# Patient Record
Sex: Female | Born: 1975
Health system: Southern US, Community
[De-identification: ages and names within clinical notes are randomized; demographics above are authoritative.]

## PROBLEM LIST (undated history)

## (undated) DIAGNOSIS — E039 Hypothyroidism, unspecified: Secondary | ICD-10-CM

## (undated) DIAGNOSIS — D649 Anemia, unspecified: Secondary | ICD-10-CM

## (undated) DIAGNOSIS — Z90710 Acquired absence of both cervix and uterus: Secondary | ICD-10-CM

## (undated) HISTORY — DX: Acquired absence of both cervix and uterus: Z90.710

## (undated) HISTORY — DX: Anemia, unspecified: D64.9

## (undated) HISTORY — PX: AUGMENTATION MAMMAPLASTY: SUR837

## (undated) HISTORY — PX: TUBAL LIGATION: SHX77

## (undated) HISTORY — DX: Hypothyroidism, unspecified: E03.9

## (undated) HISTORY — PX: DILATION AND CURETTAGE OF UTERUS: SHX78

## (undated) HISTORY — PX: CHOLECYSTECTOMY: SHX55

---

## 2004-11-07 ENCOUNTER — Ambulatory Visit: Payer: Self-pay

## 2004-12-12 ENCOUNTER — Ambulatory Visit: Payer: Self-pay

## 2008-10-13 HISTORY — PX: GASTRIC BYPASS: SHX52

## 2008-10-18 ENCOUNTER — Ambulatory Visit: Payer: Self-pay | Admitting: Internal Medicine

## 2014-03-13 HISTORY — PX: VAGINAL HYSTERECTOMY: SUR661

## 2014-03-22 ENCOUNTER — Ambulatory Visit: Payer: Self-pay | Admitting: Obstetrics and Gynecology

## 2014-03-22 LAB — BASIC METABOLIC PANEL
ANION GAP: 1 — AB (ref 7–16)
BUN: 13 mg/dL (ref 7–18)
CHLORIDE: 105 mmol/L (ref 98–107)
Calcium, Total: 9.2 mg/dL (ref 8.5–10.1)
Co2: 32 mmol/L (ref 21–32)
Creatinine: 0.84 mg/dL (ref 0.60–1.30)
EGFR (Non-African Amer.): >60
Glucose: 69 mg/dL (ref 65–99)
Osmolality: 274 (ref 275–301)
Potassium: 3.9 mmol/L (ref 3.5–5.1)
Sodium: 138 mmol/L (ref 136–145)

## 2014-03-22 LAB — CBC
HCT: 37.3 % (ref 35.0–47.0)
HGB: 12.4 g/dL (ref 12.0–16.0)
MCH: 27.9 pg (ref 26.0–34.0)
MCHC: 33.1 g/dL (ref 32.0–36.0)
MCV: 84 fL (ref 80–100)
Platelet: 226 10*3/uL (ref 150–440)
RBC: 4.44 10*6/uL (ref 3.80–5.20)
RDW: 15.8 % — ABNORMAL HIGH (ref 11.5–14.5)
WBC: 4.3 10*3/uL (ref 3.6–11.0)

## 2014-03-27 ENCOUNTER — Ambulatory Visit: Payer: Self-pay | Admitting: Obstetrics and Gynecology

## 2014-03-28 LAB — PATHOLOGY REPORT

## 2014-03-28 LAB — HEMOGLOBIN: HGB: 10.6 g/dL — ABNORMAL LOW (ref 12.0–16.0)

## 2015-02-03 NOTE — Op Note (Signed)
PATIENT NAME:  Robin Stone, [name] MR#:  [id number] DATE OF BIRTH:  01-Jun-1976  DATE OF PROCEDURE:  03/27/2014  PREOPERATIVE DIAGNOSES: 1.  Leiomyoma uteri.  2.  Abnormal uterine bleeding.   POSTOPERATIVE DIAGNOSES:  1.  Leiomyoma uteri.  2.  Abnormal uterine bleeding.   OPERATIVE PROCEDURE: Transvaginal hysterectomy with bilateral salpingectomy.   SURGEON: Prentice DockerMartin A. Tejon Gracie, M.D.   FIRST ASSISTANT: Dr. Valentino Saxonherry.   SECOND ASSISTANT: Alfredo MartinezJustin Ollis, PA-S  ANESTHESIA: General endotracheal.   INDICATIONS: The patient is a 39 year old multiparous white female with history of symptomatic fibroid uterus, status post Lupron therapy for fibroid size reduction, presents now for definitive surgery.   FINDINGS AT SURGERY: Revealed a fibroid uterus that was top normal size. Fallopian tubes and ovaries appeared normal.   DESCRIPTION OF PROCEDURE: The patient was brought to the operating room where she was placed in the supine position. General endotracheal anesthesia was induced without difficulty. She was placed in dorsal lithotomy position using candy-cane stirrups. A Betadine perineal and intravaginal prep and drape was performed in standard fashion. Foley catheter was placed and was draining clear yellow urine. A weighted speculum was placed into the vagina and double-tooth tenaculum was placed onto the cervix. Posterior colpotomy was made with Mayo scissors. Uterosacral ligaments were clamped, cut and stick tied using 0 Vicryl sutures. The bladder flap was created with the Bovie cautery. The vaginal mucosa and bladder were dissected off the lower uterine segment in standard fashion. The anterior cul-de-sac was eventually entered. The cardinal broad ligament complexes were then sequentially clamped, cut, and stick tied using 0 Vicryl suture. This was carried out to the utero-ovarian ligaments which were then crossclamped, cut and stick tied. The specimen was removed from the operative field.  Moderate oozing was noted from the left infundibulopelvic ligament. Several 2-0 Vicryl sutures were used in a running locking manner in order to optimize hemostasis. This was optimally controlled. The cul-de-sac was reapproximated using a 0 chromic suture in a running baseball stitch manner. The peritoneum was closed with 0 Vicryl suture in a pursestring stitch. This was followed by closure of the vagina with simple interrupted sutures of 2-0 chromic. Upon completion of the procedure, all instrumentation was removed from the vagina. The patient was then awakened, mobilized and taken to the recovery room in satisfactory condition.   ESTIMATED BLOOD LOSS: 20 mL.   URINE OUTPUT: 200 mL.   IV FLUIDS: 1500 mL of crystalloid.   ANTIBIOTICS: Ancef antibiotic prophylaxis.   ____________________________ Prentice DockerMartin A. Debroah Shuttleworth, MD mad:sb D: 03/27/2014 09:39:31 ET T: 03/27/2014 09:53:08 ET JOB#: [id number]  cc: Daphine DeutscherMartin A. Ruweyda Macknight, MD, <Dictator> Prentice DockerMARTIN A Kelsen Celona MD ELECTRONICALLY SIGNED 04/12/2014 17:23

## 2015-04-02 DIAGNOSIS — Z90710 Acquired absence of both cervix and uterus: Secondary | ICD-10-CM

## 2015-04-02 HISTORY — DX: Acquired absence of both cervix and uterus: Z90.710

## 2015-05-03 ENCOUNTER — Encounter: Payer: Self-pay | Admitting: Obstetrics and Gynecology

## 2015-06-19 ENCOUNTER — Encounter: Payer: Self-pay | Admitting: Obstetrics and Gynecology

## 2015-10-02 ENCOUNTER — Ambulatory Visit (INDEPENDENT_AMBULATORY_CARE_PROVIDER_SITE_OTHER): Payer: 59 | Admitting: Obstetrics and Gynecology

## 2015-10-02 ENCOUNTER — Encounter: Payer: Self-pay | Admitting: Obstetrics and Gynecology

## 2015-10-02 VITALS — BP 118/81 | HR 71 | Ht 68.0 in | Wt 179.1 lb

## 2015-10-02 DIAGNOSIS — E039 Hypothyroidism, unspecified: Secondary | ICD-10-CM | POA: Insufficient documentation

## 2015-10-02 DIAGNOSIS — Z01419 Encounter for gynecological examination (general) (routine) without abnormal findings: Secondary | ICD-10-CM | POA: Diagnosis not present

## 2015-10-02 DIAGNOSIS — Z90710 Acquired absence of both cervix and uterus: Secondary | ICD-10-CM

## 2015-10-02 DIAGNOSIS — F325 Major depressive disorder, single episode, in full remission: Secondary | ICD-10-CM | POA: Insufficient documentation

## 2015-10-02 DIAGNOSIS — I10 Essential (primary) hypertension: Secondary | ICD-10-CM | POA: Insufficient documentation

## 2015-10-02 DIAGNOSIS — D649 Anemia, unspecified: Secondary | ICD-10-CM | POA: Insufficient documentation

## 2015-10-02 DIAGNOSIS — F419 Anxiety disorder, unspecified: Secondary | ICD-10-CM | POA: Insufficient documentation

## 2015-10-02 NOTE — Patient Instructions (Signed)
1.  No further Pap smears are needed. 2.  Start mammogram testing next year. 3.  Continue with healthy eating and exercise. 4.  Screening laboratory work is to be done by Dr. Judithann SheenSparks.

## 2015-10-02 NOTE — Progress Notes (Signed)
Patient ID: Robin Stone, female   DOB: 02-05-1976, 39 y.o.   MRN: [id number] Annual visit. TVH with Bilateral salpingectomy and cystoscopy (Leiomyoma; B9 Paratubal cysts)  04/01/2014 Gastric By-Pass Tubal ligation G2 P2002 (C/S) FHX: Diabetes: father BMI: 27  ANNUAL PREVENTATIVE CARE GYN  ENCOUNTER NOTE  Subjective:       Robin Stone is a 39 y.o. [phone] female here for a routine annual gynecologic exam.  Current complaints: 1.  None  Status post TVH, bilateral salpingectomy and cystoscopy in 04/01/2014 for uterine fibroid and benign paratubal cysts.  Patient also has history of gastric bypass, cesarean section 2.  Has lost 8 pounds in the past year with a BMI of 27.  No current active medical problems other than hypothyroidism.    Gynecologic History No LMP recorded. Patient has had a hysterectomy. Contraception: TVH, bilateral salpingectomy Last Pap:no further Paps needed Last mammogram: NA  Obstetric History OB History  Gravida Para Term Preterm AB SAB TAB Ectopic Multiple Living  2 2 2       2     # Outcome Date GA Lbr Len/2nd Weight Sex Delivery Anes PTL Lv  2 Term     M CS-LTranv   Y  1 Term     M CS-LTranv   Y      Past Medical History  Diagnosis Date  . Hypothyroidism   . Anemia     Past Surgical History  Procedure Laterality Date  . Vaginal hysterectomy  03/2014    with bilateal salpingectomy - leiomyoma  . Gastric bypass  2010  . Dilation and curettage of uterus    . Cesarean section    . Cholecystectomy    . Tubal ligation      Current Outpatient Prescriptions on File Prior to Visit  Medication Sig Dispense Refill  . cetirizine (ZYRTEC) 10 MG tablet Take 10 mg by mouth daily.    Marland Kitchen. ibuprofen (ADVIL,MOTRIN) 200 MG tablet Take 200 mg by mouth every 6 (six) hours as needed.    Marland Kitchen. levothyroxine (SYNTHROID, LEVOTHROID) 100 MCG tablet Take 100 mcg by mouth daily before breakfast.    . traZODone (DESYREL) 100 MG tablet Take 100 mg by mouth at  bedtime.    . ferrous sulfate 325 (65 FE) MG tablet Take 325 mg by mouth daily with breakfast. Reported on 10/02/2015     No current facility-administered medications on file prior to visit.    Allergies  Allergen Reactions  . Sulfa Antibiotics     Social History   Social History  . Marital Status: Married    Spouse Name: N/A  . Number of Children: N/A  . Years of Education: N/A   Occupational History  . Not on file.   Social History Main Topics  . Smoking status: Never Smoker   . Smokeless tobacco: Not on file  . Alcohol Use: No  . Drug Use: No  . Sexual Activity: Yes    Birth Control/ Protection: Surgical   Other Topics Concern  . Not on file   Social History Narrative    Family History  Problem Relation Age of Onset  . Diabetes Father   . Heart disease Neg Hx   . Cancer Neg Hx     The following portions of the patient's history were reviewed and updated as appropriate: allergies, current medications, past family history, past medical history, past social history, past surgical history and problem list.  Review of Systems ROS Review of Systems -  General ROS: negative for - chills, fatigue, fever, hot flashes, night sweats, weight gain or weight loss Psychological ROS: negative for - anxiety, decreased libido, depression, mood swings, physical abuse or sexual abuse Ophthalmic ROS: negative for - blurry vision, eye pain or loss of vision ENT ROS: negative for - headaches, hearing change, visual changes or vocal changes Allergy and Immunology ROS: negative for - hives, itchy/watery eyes or seasonal allergies Hematological and Lymphatic ROS: negative for - bleeding problems, bruising, swollen lymph nodes or weight loss Endocrine ROS: negative for - galactorrhea, hair pattern changes, hot flashes, malaise/lethargy, mood swings, palpitations, polydipsia/polyuria, skin changes, temperature intolerance or unexpected weight changes Breast ROS: negative for - new or  changing breast lumps or nipple discharge Respiratory ROS: negative for - cough or shortness of breath Cardiovascular ROS: negative for - chest pain, irregular heartbeat, palpitations or shortness of breath Gastrointestinal ROS: no abdominal pain, change in bowel habits, or black or bloody stools Genito-Urinary ROS: no dysuria, trouble voiding, or hematuria Musculoskeletal ROS: negative for - joint pain or joint stiffness Neurological ROS: negative for - bowel and bladder control changes Dermatological ROS: negative for rash and skin lesion changes   Objective:   BP 118/81 mmHg  Pulse 71  Ht  (1.727 m)  Wt 179 lb 2 oz (81.251 kg)  BMI 27.24 kg/m2 CONSTITUTIONAL: Well-developed, well-nourished female in no acute distress.  PSYCHIATRIC: Normal mood and affect. Normal behavior. Normal judgment and thought content. NEUROLGIC: Alert and oriented to person, place, and time. Normal muscle tone coordination. No cranial nerve deficit noted. HENT:  Normocephalic, atraumatic, External right and left ear normal. Oropharynx is clear and moist EYES: Conjunctivae and EOM are normal. Pupils are equal, round, and reactive to light. No scleral icterus.  NECK: Normal range of motion, supple, no masses.  Normal thyroid.  SKIN: Skin is warm and dry. No rash noted. Not diaphoretic. No erythema. No pallor. CARDIOVASCULAR: Normal heart rate noted, regular rhythm, no murmur. RESPIRATORY: Clear to auscultation bilaterally. Effort and breath sounds normal, no problems with respiration noted. BREASTS: Symmetric in size. No masses, skin changes, nipple drainage, or lymphadenopathy. Mammoplasty scars healed. ABDOMEN: Soft, normal bowel sounds, no distention noted.  No tenderness, rebound or guarding.  BLADDER: Normal PELVIC:  External Genitalia: Normal  BUS: Normal  Vagina: Normal with good vault support  Cervix: surgically absent  Uterus: surgically absent  Adnexa: Normal  RV: External Exam NormaI   MUSCULOSKELETAL: Normal range of motion. No tenderness.  No cyanosis, clubbing, or edema.  2+ distal pulses. LYMPHATIC: No Axillary, Supraclavicular, or Inguinal Adenopathy.    Assessment:   Annual gynecologic examination 40 y.o. Contraception: status post TVH, bilateral salpingectomy  BMI 27   Plan:  Pap: Not needed Mammogram: Not Indicated Stool Guaiac Testing:  Not Indicated Labs: per Dr. Judithann Sheen Routine preventative health maintenance measures emphasized: Exercise/Diet/Weight control, Tobacco Warnings and Alcohol/Substance use risks Start mammograms next year Return to Clinic - 1 Year   Herold Harms, MD  Note: This dictation was prepared with Dragon dictation along with smaller phrase technology. Any transcriptional errors that result from this process are unintentional.

## 2016-02-11 DIAGNOSIS — Z79899 Other long term (current) drug therapy: Secondary | ICD-10-CM | POA: Diagnosis not present

## 2016-02-11 DIAGNOSIS — I10 Essential (primary) hypertension: Secondary | ICD-10-CM | POA: Diagnosis not present

## 2016-02-11 DIAGNOSIS — Z13220 Encounter for screening for lipoid disorders: Secondary | ICD-10-CM | POA: Diagnosis not present

## 2016-02-13 DIAGNOSIS — R61 Generalized hyperhidrosis: Secondary | ICD-10-CM | POA: Diagnosis not present

## 2016-02-13 DIAGNOSIS — F325 Major depressive disorder, single episode, in full remission: Secondary | ICD-10-CM | POA: Diagnosis not present

## 2016-02-13 DIAGNOSIS — E039 Hypothyroidism, unspecified: Secondary | ICD-10-CM | POA: Diagnosis not present

## 2016-02-13 DIAGNOSIS — I10 Essential (primary) hypertension: Secondary | ICD-10-CM | POA: Diagnosis not present

## 2016-02-13 DIAGNOSIS — D649 Anemia, unspecified: Secondary | ICD-10-CM | POA: Diagnosis not present

## 2016-02-21 DIAGNOSIS — R002 Palpitations: Secondary | ICD-10-CM | POA: Diagnosis not present

## 2016-03-11 DIAGNOSIS — J0100 Acute maxillary sinusitis, unspecified: Secondary | ICD-10-CM | POA: Diagnosis not present

## 2016-03-25 ENCOUNTER — Other Ambulatory Visit: Payer: Self-pay | Admitting: Internal Medicine

## 2016-03-25 DIAGNOSIS — Z1231 Encounter for screening mammogram for malignant neoplasm of breast: Secondary | ICD-10-CM

## 2016-04-18 ENCOUNTER — Ambulatory Visit
Admission: RE | Admit: 2016-04-18 | Discharge: 2016-04-18 | Disposition: A | Discharge status: Home / Self Care | Payer: 59 | Source: Ambulatory Visit | Attending: Internal Medicine | Admitting: Internal Medicine

## 2016-04-18 DIAGNOSIS — Z9882 Breast implant status: Secondary | ICD-10-CM | POA: Diagnosis not present

## 2016-04-18 DIAGNOSIS — Z1231 Encounter for screening mammogram for malignant neoplasm of breast: Secondary | ICD-10-CM | POA: Diagnosis not present

## 2016-05-05 ENCOUNTER — Encounter: Payer: Self-pay | Admitting: Registered Nurse

## 2016-05-05 ENCOUNTER — Ambulatory Visit: Payer: Self-pay | Admitting: Registered Nurse

## 2016-05-05 VITALS — BP 120/82 | HR 72 | Temp 98.7°F

## 2016-05-05 DIAGNOSIS — M62838 Other muscle spasm: Secondary | ICD-10-CM

## 2016-05-05 MED ORDER — METAXALONE 800 MG PO TABS: 800.0000 mg | ORAL_TABLET | Freq: Three times a day (TID) | ORAL | 0 refills | Status: AC

## 2016-05-05 NOTE — Progress Notes (Signed)
Subjective:    Patient ID: Robin Stone, female    DOB: 1976/02/24, 40 y.o.   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  Denied loss of bowel/bladder control, saddle paresthesias or arm/leg weakness.  Had pinched nerve 10 years ago  Took muscle relaxers and cleared up in 2 days this is lasting much longer.  Has worked overtime recently.  Usual lifting at work.   Back Pain  Associated symptoms include numbness. Pertinent negatives include no abdominal pain, chest pain, fever, headaches or weakness.      Review of Systems  Constitutional: Negative for activity change, appetite change, chills, diaphoresis, fatigue, fever and unexpected weight change.  HENT: Negative for congestion, dental problem, ear discharge and ear pain.   Eyes: Negative for photophobia, pain, discharge, redness, itching and visual disturbance.  Respiratory: Negative for cough, chest tightness, shortness of breath, wheezing and stridor.   Cardiovascular: Negative for chest pain and leg swelling.  Gastrointestinal: Negative for abdominal pain, diarrhea, nausea and vomiting.  Endocrine: Negative for polydipsia, polyphagia and polyuria.  Musculoskeletal: Positive for arthralgias, back pain, myalgias and neck pain. Negative for gait problem, joint swelling and neck stiffness.  Skin: Negative for color change, pallor, rash and wound.  Allergic/Immunologic: Positive for environmental allergies. Negative for food allergies.  Neurological: Positive for numbness. Negative for dizziness, tremors, seizures, syncope, facial asymmetry, speech difficulty, weakness, light-headedness and headaches.  Hematological: Negative for adenopathy. Does not bruise/bleed easily.  Psychiatric/Behavioral: Positive for sleep disturbance.       Objective:   Physical Exam  Constitutional: She is  oriented to person, place, and time. Vital signs are normal. She appears well-developed and well-nourished.  Non-toxic appearance. She does not have a sickly appearance. She does not appear ill. No distress.  HENT:  Head: Normocephalic and atraumatic.  Right Ear: Hearing and external ear normal.  Left Ear: Hearing and external ear normal.  Nose: Nose normal.  Mouth/Throat: Uvula is midline, oropharynx is clear and moist and mucous membranes are normal. No oropharyngeal exudate.  Eyes: Conjunctivae and EOM are normal. Pupils are equal, round, and reactive to light. Right eye exhibits no discharge. Left eye exhibits no discharge. No scleral icterus.  Neck: Trachea normal and phonation normal. Neck supple. Spinous process tenderness and muscular tenderness present. No tracheal tenderness present. No neck rigidity. Decreased range of motion present. No tracheal deviation, no edema and no erythema present. No thyromegaly present.    Cardiovascular: Normal rate, regular rhythm, normal heart sounds and intact distal pulses.  PMI is not displaced.  Exam reveals no gallop and no friction rub.   No murmur heard. Pulses:      Radial pulses are 2+ on the right side, and 2+ on the left side.  Pulmonary/Chest: Effort normal and breath sounds normal. No stridor. No respiratory distress. She has no decreased breath sounds. She has no wheezes. She has no rhonchi. She has no rales. She exhibits no tenderness.  Abdominal: Soft. She exhibits no distension. There is no guarding.  Musculoskeletal: She exhibits tenderness. She exhibits no edema or deformity.       Right shoulder: Normal.       Left shoulder: Normal.       Right elbow: Normal.      Left elbow: Normal.       Right hip: Normal.  Left hip: Normal.       Right knee: Normal.       Left knee: Normal.       Cervical back: She exhibits decreased range of motion, tenderness, bony tenderness, pain and spasm. She exhibits no swelling, no edema, no  deformity, no laceration and normal pulse.       Thoracic back: She exhibits decreased range of motion, pain and spasm. She exhibits no tenderness, no bony tenderness, no swelling, no edema, no deformity, no laceration and normal pulse.       Lumbar back: Normal.       Right hand: Normal.       Left hand: Normal.  Lymphadenopathy:    She has no cervical adenopathy.  Neurological: She is alert and oriented to person, place, and time. She has normal reflexes. She is not disoriented. She displays no atrophy, no tremor and normal reflexes. No cranial nerve deficit. She exhibits normal muscle tone. She displays no seizure activity. Coordination and gait normal. GCS eye subscore is 4. GCS verbal subscore is 5. GCS motor subscore is 6.  Reflex Scores:      Brachioradialis reflexes are 2+ on the right side and 2+ on the left side.      Patellar reflexes are 2+ on the right side and 2+ on the left side. Bilateral hand grasp equal 5/5 strength and leg strength 5/5 equal bilaterally  Skin: Skin is warm and dry. No rash noted. She is not diaphoretic. No erythema. No pallor.  Psychiatric: She has a normal mood and affect. Her speech is normal and behavior is normal. Judgment and thought content normal.  Nursing note and vitals reviewed.         Assessment & Plan:  A-cervical paraspinal muscle spasm acute  P-Skelaxin  po TID prn muscle spasms.  Home stretches demonstrated to patient-e.g. Arm circles, walking up wall, chest stretches, neck AROM, chin tucks, knee to chest and rock side to side on back.  Consider physical therapy referral if no improvement with prescribed therapy.  Ensure ergonomics correct desk at work avoid repetitive motions if possible/holding phone/laptop in hand use desk/stand.  Patient was instructed to rest, ice, and ROM exercises.  Activity as tolerated.  Avoid alcohol intake while taking skelaxin.  Follow up if symptoms persist or worsen. Discussed slow position changes, no  driving x 8 hours after taking skelaxin probable drowsiness side effect in combination with her chronic medications.  Refused work note. Patient verbalized agreement and understanding of treatment plan.  P2:  [employer].

## 2016-05-12 ENCOUNTER — Encounter: Payer: Self-pay | Admitting: Emergency Medicine

## 2016-05-12 ENCOUNTER — Emergency Department
Admission: EM | Admit: 2016-05-12 | Discharge: 2016-05-12 | Disposition: A | Discharge status: Home / Self Care | Payer: 59 | Attending: Emergency Medicine | Admitting: Emergency Medicine

## 2016-05-12 ENCOUNTER — Emergency Department: Payer: 59

## 2016-05-12 DIAGNOSIS — I10 Essential (primary) hypertension: Secondary | ICD-10-CM | POA: Insufficient documentation

## 2016-05-12 DIAGNOSIS — Y999 Unspecified external cause status: Secondary | ICD-10-CM | POA: Insufficient documentation

## 2016-05-12 DIAGNOSIS — Y929 Unspecified place or not applicable: Secondary | ICD-10-CM | POA: Diagnosis not present

## 2016-05-12 DIAGNOSIS — S99922A Unspecified injury of left foot, initial encounter: Secondary | ICD-10-CM | POA: Diagnosis present

## 2016-05-12 DIAGNOSIS — Y9389 Activity, other specified: Secondary | ICD-10-CM | POA: Insufficient documentation

## 2016-05-12 DIAGNOSIS — M79672 Pain in left foot: Secondary | ICD-10-CM | POA: Diagnosis not present

## 2016-05-12 DIAGNOSIS — S9032XA Contusion of left foot, initial encounter: Secondary | ICD-10-CM | POA: Insufficient documentation

## 2016-05-12 DIAGNOSIS — E039 Hypothyroidism, unspecified: Secondary | ICD-10-CM | POA: Insufficient documentation

## 2016-05-12 DIAGNOSIS — W208XXA Other cause of strike by thrown, projected or falling object, initial encounter: Secondary | ICD-10-CM | POA: Diagnosis not present

## 2016-05-12 MED ORDER — IBUPROFEN 600 MG PO TABS
600.0000 mg | ORAL_TABLET | Freq: Once | ORAL | Status: AC
Start: 2016-05-12 — End: 2016-05-12
  Administered 2016-05-12: 600 mg via ORAL
  Filled 2016-05-12: qty 1

## 2016-05-12 MED ORDER — OXYCODONE-ACETAMINOPHEN 5-325 MG PO TABS: 1.0000 | ORAL_TABLET | Freq: Four times a day (QID) | ORAL | 0 refills | Status: DC | PRN

## 2016-05-12 MED ORDER — IBUPROFEN 600 MG PO TABS
ORAL_TABLET | ORAL | Status: AC
  Filled 2016-05-12: qty 1

## 2016-05-12 MED ORDER — NAPROXEN 500 MG PO TABS: 500.0000 mg | ORAL_TABLET | Freq: Two times a day (BID) | ORAL | 0 refills | Status: AC

## 2016-05-12 MED ORDER — OXYCODONE-ACETAMINOPHEN 5-325 MG PO TABS
1.0000 | ORAL_TABLET | Freq: Once | ORAL | Status: AC
  Administered 2016-05-12: 1 via ORAL
  Filled 2016-05-12: qty 1

## 2016-05-12 NOTE — ED Triage Notes (Signed)
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 Patient with pain and swelling to left foot.

## 2016-05-12 NOTE — ED Provider Notes (Signed)
[facility] Emergency Department Provider Note   ____________________________________________   First MD Initiated Contact with Patient 05/12/16 2021     (approximate)  I have reviewed the triage vital signs and the nursing notes.   HISTORY  Chief Complaint Foot Pain    HPI Robin Stone is a 40 y.o. female patient complain of dorsal left foot pain secondary to blunt trauma. 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 Patient stated there is immediate edema to the dorsal aspect of the foot. Patient state when she flexes her toes tested numbness and tingling sensation. Patient state weightbearing causes increased pain. No palliative measures taken for this complaint. Patient rates the pain as a 6/10. Patient describes the pain as sharp.   Past Medical History:  Diagnosis Date  . Anemia   . History of total vaginal hysterectomy (TVH) 04/02/2015   TVH, bilateral salpingectomy  . Hypothyroidism     Patient Active Problem List   Diagnosis Date Noted  . Absolute anemia 10/02/2015  . Anxiety 10/02/2015  . Benign hypertension 10/02/2015  . Hypothyroidism 10/02/2015  . Major depression in remission (HCC) 10/02/2015  . Morbid obesity (HCC) 10/02/2015  . History of total vaginal hysterectomy (TVH) 04/02/2015    Past Surgical History:  Procedure Laterality Date  . AUGMENTATION MAMMAPLASTY Bilateral    implant and areola reduction  . CESAREAN SECTION    . CHOLECYSTECTOMY    . DILATION AND CURETTAGE OF UTERUS    . GASTRIC BYPASS  2010  . TUBAL LIGATION    . VAGINAL HYSTERECTOMY  03/2014   with bilateal salpingectomy - leiomyoma    Prior to Admission medications   Medication Sig Start Date End Date Taking? Authorizing Provider  cetirizine (ZYRTEC) 10 MG tablet Take 10 mg by mouth daily.    Historical Provider, MD  ferrous sulfate 325 (65 FE) MG tablet Take 325 mg by mouth daily with breakfast. Reported on  10/02/2015    Historical Provider, MD  hydrochlorothiazide (HYDRODIURIL) 25 MG tablet Take by mouth. 08/16/15   Historical Provider, MD  ibuprofen (ADVIL,MOTRIN) 200 MG tablet Take 200 mg by mouth every 6 (six) hours as needed.    Historical Provider, MD  levothyroxine (SYNTHROID, LEVOTHROID) 100 MCG tablet Take 100 mcg by mouth daily before breakfast.    Historical Provider, MD  naproxen (NAPROSYN) 500 MG tablet Take 1 tablet (500 mg total) by mouth 2 (two) times daily with a meal. 05/12/16   Joni Reining, PA-C  oxyCODONE-acetaminophen (ROXICET) 5-325 MG tablet Take 1 tablet by mouth every 6 (six) hours as needed for moderate pain. 05/12/16   Joni Reining, PA-C  sertraline (ZOLOFT) 50 MG tablet Take by mouth. 12/04/15   Historical Provider, MD  traZODone (DESYREL) 100 MG tablet Take 100 mg by mouth at bedtime.    Historical Provider, MD    Allergies Sulfa antibiotics  Family History  Problem Relation Age of Onset  . Diabetes Father   . Heart disease Neg Hx   . Cancer Neg Hx     Social History Social History  Substance Use Topics  . Smoking status: Never Smoker  . Smokeless tobacco: Never Used  . Alcohol use No    Review of Systems Constitutional: No fever/chills Eyes: No visual changes. ENT: No sore throat. Cardiovascular: Denies chest pain. Respiratory: Denies shortness of breath. Gastrointestinal: No abdominal pain.  No nausea, no vomiting.  No diarrhea.  No constipation. Genitourinary: Negative for dysuria.  Musculoskeletal: Left foot pain Skin: Negative for rash. Neurological: Negative for headaches, focal weakness or numbness. Psychiatric: Anxiety Endocrine:Hypothyroidism Hematological/Lymphatic: Allergic/Immunilogical: Sulfa ____________________________________________   PHYSICAL EXAM:  VITAL SIGNS: ED Triage Vitals  Enc Vitals Group     BP 05/12/16 1909 126/88     Pulse Rate 05/12/16 1909 64     Resp 05/12/16 1909 18     Temp 05/12/16 1909 97.9 F (36.6  C)     Temp Source 05/12/16 1909 Oral     SpO2 05/12/16 1909 99 %     Weight 05/12/16 1909 170 lb (77.1 kg)     Height 05/12/16 1909  (1.727 m)     Head Circumference --      Peak Flow --      Pain Score 05/12/16 1920 6     Pain Loc --      Pain Edu? --      Excl. in GC? --     Constitutional: Alert and oriented. Well appearing and in no acute distress. Eyes: Conjunctivae are normal. PERRL. EOMI. Head: Atraumatic. Nose: No congestion/rhinnorhea. Mouth/Throat: Mucous membranes are moist.  Oropharynx non-erythematous. Neck: No stridor.  No cervical spine tenderness to palpation. Hematological/Lymphatic/Immunilogical: No cervical lymphadenopathy. Cardiovascular: Normal rate, regular rhythm. Grossly normal heart sounds.  Good peripheral circulation. Respiratory: Normal respiratory effort.  No retractions. Lungs CTAB. Gastrointestinal: Soft and nontender. No distention. No abdominal bruits. No CVA tenderness. Genitourinary:  Musculoskeletal: No obvious deformity of the left foot. Ecchymosis dorsal aspect of the left foot. Moderate guarding palpation dose aspect of the left foot. Neurologic:  Normal speech and language. No gross focal neurologic deficits are appreciated. No gait instability. Skin:  Skin is warm, dry and intact. No rash noted. Abrasion and ecchymosis Psychiatric: Mood and affect are normal. Speech and behavior are normal.  ____________________________________________   LABS (all labs ordered are listed, but only abnormal results are displayed)  Labs Reviewed - No data to display ____________________________________________  EKG   ____________________________________________  RADIOLOGY  No acute findings on x-ray of the left foot. ____________________________________________   PROCEDURES  Procedure(s) performed: None  Procedures  Critical Care performed: No  ____________________________________________   INITIAL IMPRESSION / ASSESSMENT AND PLAN  / ED COURSE  Pertinent labs & imaging results that were available during my care of the patient were reviewed by me and considered in my medical decision making (see chart for details).  Left foot contusion. Discussed x-ray finding with patient. Patient placed an open shoe. Patient given discharge care instructions. Patient advised no prolonged standing for 3 days.  Clinical Course     ____________________________________________   FINAL CLINICAL IMPRESSION(S) / ED DIAGNOSES  Final diagnoses:  Foot contusion, left, initial encounter      NEW MEDICATIONS STARTED DURING THIS VISIT:  New Prescriptions   NAPROXEN (NAPROSYN) 500 MG TABLET    Take 1 tablet (500 mg total) by mouth 2 (two) times daily with a meal.   OXYCODONE-ACETAMINOPHEN (ROXICET) 5-325 MG TABLET    Take 1 tablet by mouth every 6 (six) hours as needed for moderate pain.     Note:  This document was prepared using Dragon voice recognition software and may include unintentional dictation errors.    Joni Reining, PA-C 05/12/16 2117    Loleta Rose, MD 05/13/16 [phone]

## 2016-05-12 NOTE — ED Notes (Signed)
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 States she propped it up for rest of practice. Bruising noticed L top/side of foot. Pt states when she bends her toes she feels a tingling/numb sensation in foot.

## 2016-05-12 NOTE — Discharge Instructions (Signed)
Were open shoe for 3-5 days as needed.

## 2016-05-12 NOTE — ED Notes (Signed)
  Reviewed d/c instructions, follow-up care, and prescriptions with pt. Pt verbalized understanding 

## 2016-07-19 DIAGNOSIS — J029 Acute pharyngitis, unspecified: Secondary | ICD-10-CM | POA: Diagnosis not present

## 2016-07-19 DIAGNOSIS — J069 Acute upper respiratory infection, unspecified: Secondary | ICD-10-CM | POA: Diagnosis not present

## 2016-08-08 DIAGNOSIS — E039 Hypothyroidism, unspecified: Secondary | ICD-10-CM | POA: Diagnosis not present

## 2016-08-08 DIAGNOSIS — I10 Essential (primary) hypertension: Secondary | ICD-10-CM | POA: Diagnosis not present

## 2016-08-08 DIAGNOSIS — Z79899 Other long term (current) drug therapy: Secondary | ICD-10-CM | POA: Diagnosis not present

## 2016-08-08 DIAGNOSIS — R002 Palpitations: Secondary | ICD-10-CM | POA: Diagnosis not present

## 2016-08-15 DIAGNOSIS — F325 Major depressive disorder, single episode, in full remission: Secondary | ICD-10-CM | POA: Diagnosis not present

## 2016-08-15 DIAGNOSIS — E039 Hypothyroidism, unspecified: Secondary | ICD-10-CM | POA: Diagnosis not present

## 2016-08-15 DIAGNOSIS — I10 Essential (primary) hypertension: Secondary | ICD-10-CM | POA: Diagnosis not present

## 2016-08-15 DIAGNOSIS — F3341 Major depressive disorder, recurrent, in partial remission: Secondary | ICD-10-CM | POA: Diagnosis not present

## 2016-09-26 NOTE — Progress Notes (Signed)
[facility] Patient ID: Robin Stone, female   DOB: [birth year], 40 y.o.   MRN: [id number] Annual visit. TVH with Bilateral salpingectomy and cystoscopy (Leiomyoma; B9 Paratubal cysts)  04/01/2014 Gastric By-Pass Tubal ligation G2 P2002 (C/S) FHX: Diabetes: father BMI: 27  ANNUAL PREVENTATIVE CARE GYN  ENCOUNTER NOTE  Subjective:       Robin Stone is a 40 y.o. 172P2002 female here for a routine annual gynecologic exam.  Current complaints: 1.  None  Status post TVH, bilateral salpingectomy and cystoscopy in 04/01/2014 for uterine fibroid and benign paratubal cysts.  Patient also has history of gastric bypass, cesarean section 2.  Has Gained 13 pounds in the past year. No current active medical problems other than hypothyroidism which is monitored by Dr. Judithann SheenSparks   Gynecologic History No LMP recorded. Patient has had a hysterectomy. Contraception: TVH, bilateral salpingectomy Last Pap:no further Paps needed Last mammogram: 04/2016 birad 1  Obstetric History OB History  Gravida Para Term Preterm AB Living  2 2 2     2   SAB TAB Ectopic Multiple Live Births          2    # Outcome Date GA Lbr Len/2nd Weight Sex Delivery Anes PTL Lv  2 Term     M CS-LTranv   LIV  1 Term     M CS-LTranv   LIV      Past Medical History:  Diagnosis Date  . Anemia   . History of total vaginal hysterectomy (TVH) 04/02/2015   TVH, bilateral salpingectomy  . Hypothyroidism     Past Surgical History:  Procedure Laterality Date  . AUGMENTATION MAMMAPLASTY Bilateral    implant and areola reduction  . CESAREAN SECTION    . CHOLECYSTECTOMY    . DILATION AND CURETTAGE OF UTERUS    . GASTRIC BYPASS  2010  . TUBAL LIGATION    . VAGINAL HYSTERECTOMY  03/2014   with bilateal salpingectomy - leiomyoma    Current Outpatient Prescriptions on File Prior to Visit  Medication Sig Dispense Refill  . cetirizine (ZYRTEC) 10 MG tablet Take 10 mg by mouth daily.    . ferrous sulfate 325 (65 FE) MG  tablet Take 325 mg by mouth daily with breakfast. Reported on 10/02/2015    . hydrochlorothiazide (HYDRODIURIL) 25 MG tablet Take by mouth.    Marland Kitchen. ibuprofen (ADVIL,MOTRIN) 200 MG tablet Take 200 mg by mouth every 6 (six) hours as needed.    Marland Kitchen. levothyroxine (SYNTHROID, LEVOTHROID) 100 MCG tablet Take 100 mcg by mouth daily before breakfast.    . naproxen (NAPROSYN) 500 MG tablet Take 1 tablet (500 mg total) by mouth 2 (two) times daily with a meal. 14 tablet 0  . oxyCODONE-acetaminophen (ROXICET) 5-325 MG tablet Take 1 tablet by mouth every 6 (six) hours as needed for moderate pain. 12 tablet 0  . sertraline (ZOLOFT) 50 MG tablet Take by mouth.    . traZODone (DESYREL) 100 MG tablet Take 100 mg by mouth at bedtime.     No current facility-administered medications on file prior to visit.     Allergies  Allergen Reactions  . Sulfa Antibiotics     Social History   Social History  . Marital status: Married    Spouse name: N/A  . Number of children: N/A  . Years of education: N/A   Occupational History  . Not on file.   Social History Main Topics  . Smoking status: Never Smoker  . Smokeless tobacco: Never  Used  . Alcohol use No  . Drug use: No  . Sexual activity: Not on file   Other Topics Concern  . Not on file   Social History Narrative  . No narrative on file    Family History  Problem Relation Age of Onset  . Diabetes Father   . Heart disease Neg Hx   . Cancer Neg Hx     The following portions of the patient's history were reviewed and updated as appropriate: allergies, current medications, past family history, past medical history, past social history, past surgical history and problem list.  Review of Systems ROS Review of Systems - General ROS: negative for - chills, fatigue, fever, hot flashes, night sweats, weight gain or weight loss Psychological ROS: negative for - anxiety, decreased libido, depression, mood swings, physical abuse or sexual abuse Ophthalmic  ROS: negative for - blurry vision, eye pain or loss of vision ENT ROS: negative for - headaches, hearing change, visual changes or vocal changes Allergy and Immunology ROS: negative for - hives, itchy/watery eyes or seasonal allergies Hematological and Lymphatic ROS: negative for - bleeding problems, bruising, swollen lymph nodes or weight loss Endocrine ROS: negative for - galactorrhea, hair pattern changes, hot flashes, malaise/lethargy, mood swings, palpitations, polydipsia/polyuria, skin changes, temperature intolerance or unexpected weight changes Breast ROS: negative for - new or changing breast lumps or nipple discharge Respiratory ROS: negative for - cough or shortness of breath Cardiovascular ROS: negative for - chest pain, irregular heartbeat, palpitations or shortness of breath Gastrointestinal ROS: no abdominal pain, change in bowel habits, or black or bloody stools Genito-Urinary ROS: no dysuria, trouble voiding, or hematuria Musculoskeletal ROS: negative for - joint pain or joint stiffness Neurological ROS: negative for - bowel and bladder control changes Dermatological ROS: negative for rash and skin lesion changes   Objective:   BP 103/68   Pulse 92   Ht 5\' 8"  (1.727 m)   Wt 185 lb 7 oz (84.1 kg)   BMI 28.20 kg/m  CONSTITUTIONAL: Well-developed, well-nourished female in no acute distress.  PSYCHIATRIC: Normal mood and affect. Normal behavior. Normal judgment and thought content. NEUROLGIC: Alert and oriented to person, place, and time. Normal muscle tone coordination. No cranial nerve deficit noted. HENT:  Normocephalic, atraumatic, External right and left ear normal. Oropharynx is clear and moist EYES: Conjunctivae and EOM are normal. Pupils are equal, round, and reactive to light. No scleral icterus.  NECK: Normal range of motion, supple, no masses.  Normal thyroid.  SKIN: Skin is warm and dry. No rash noted. Not diaphoretic. No erythema. No pallor. CARDIOVASCULAR:  Normal heart rate noted, regular rhythm, no murmur. RESPIRATORY: Clear to auscultation bilaterally. Effort and breath sounds normal, no problems with respiration noted. BREASTS: Symmetric in size. No masses, skin changes, nipple drainage, or lymphadenopathy. Mammoplasty scars healed. ABDOMEN: Soft, normal bowel sounds, no distention noted.  No tenderness, rebound or guarding.  BLADDER: Normal PELVIC:  External Genitalia: Normal  BUS: Normal  Vagina: Normal with good vault support  Cervix: surgically absent  Uterus: surgically absent  Adnexa: Normal  RV: External Exam NormaI  MUSCULOSKELETAL: Normal range of motion. No tenderness.  No cyanosis, clubbing, or edema.  2+ distal pulses. LYMPHATIC: No Axillary, Supraclavicular, or Inguinal Adenopathy.    Assessment:   Annual gynecologic examination 40 y.o. Contraception: status post TVH, bilateral salpingectomy  BMI 27  Weight gain   Plan:  Pap: Not needed Mammogram:utd Stool Guaiac Testing:  Not Indicated Labs: per Dr. Judithann Sheen Routine preventative  health maintenance measures emphasized: Exercise/Diet/Weight control, Tobacco Warnings and Alcohol/Substance use risks Start mammograms next year Return to Clinic - 1 Year   Darol Destinerystal Miller, [place]  Note: This dictation was prepared with Dragon dictation along with smaller phrase technology. Any transcriptional errors that result from this process are unintentional.

## 2016-10-02 ENCOUNTER — Encounter: Payer: 59 | Admitting: Obstetrics and Gynecology

## 2016-10-02 ENCOUNTER — Encounter: Payer: Self-pay | Admitting: Obstetrics and Gynecology

## 2016-10-02 ENCOUNTER — Ambulatory Visit: Payer: 59 | Admitting: Obstetrics and Gynecology

## 2016-10-02 VITALS — BP 103/68 | HR 92 | Ht 68.0 in | Wt 185.4 lb

## 2016-10-02 DIAGNOSIS — Z90710 Acquired absence of both cervix and uterus: Secondary | ICD-10-CM | POA: Diagnosis not present

## 2016-10-02 DIAGNOSIS — Z9884 Bariatric surgery status: Secondary | ICD-10-CM | POA: Insufficient documentation

## 2016-10-02 DIAGNOSIS — Z01419 Encounter for gynecological examination (general) (routine) without abnormal findings: Secondary | ICD-10-CM

## 2016-10-02 DIAGNOSIS — R635 Abnormal weight gain: Secondary | ICD-10-CM | POA: Diagnosis not present

## 2016-10-02 NOTE — Patient Instructions (Signed)
1. No further Pap smears needed 2. Mammogram already obtained this year 3. Continue with healthy eating and exercise with controlled weight loss 4. Screening labs through primary care-Dr. Doy Hutching 5. Return in 1 year for annual exam  Health Maintenance, Female Introduction Adopting a healthy lifestyle and getting preventive care can go a long way to promote health and wellness. Talk with your health care provider about what schedule of regular examinations is right for you. This is a good chance for you to check in with your provider about disease prevention and staying healthy. In between checkups, there are plenty of things you can do on your own. Experts have done a lot of research about which lifestyle changes and preventive measures are most likely to keep you healthy. Ask your health care provider for more information. Weight and diet Eat a healthy diet  Be sure to include plenty of vegetables, fruits, low-fat dairy products, and lean protein.  Do not eat a lot of foods high in solid fats, added sugars, or salt.  Get regular exercise. This is one of the most important things you can do for your health.  Most adults should exercise for at least 150 minutes each week. The exercise should increase your heart rate and make you sweat (moderate-intensity exercise).  Most adults should also do strengthening exercises at least twice a week. This is in addition to the moderate-intensity exercise. Maintain a healthy weight  Body mass index (BMI) is a measurement that can be used to identify possible weight problems. It estimates body fat based on height and weight. Your health care provider can help determine your BMI and help you achieve or maintain a healthy weight.  For females 36 years of age and older:  A BMI below 18.5 is considered underweight.  A BMI of 18.5 to 24.9 is normal.  A BMI of 25 to 29.9 is considered overweight.  A BMI of 30 and above is considered obese. Watch levels  of cholesterol and blood lipids  You should start having your blood tested for lipids and cholesterol at 40 years of age, then have this test every 5 years.  You may need to have your cholesterol levels checked more often if:  Your lipid or cholesterol levels are high.  You are older than 40 years of age.  You are at high risk for heart disease. Cancer screening Lung Cancer  Lung cancer screening is recommended for adults 52-98 years old who are at high risk for lung cancer because of a history of smoking.  A yearly low-dose CT scan of the lungs is recommended for people who:  Currently smoke.  Have quit within the past 15 years.  Have at least a 30-pack-year history of smoking. A pack year is smoking an average of one pack of cigarettes a day for 1 year.  Yearly screening should continue until it has been 15 years since you quit.  Yearly screening should stop if you develop a health problem that would prevent you from having lung cancer treatment. Breast Cancer  Practice breast self-awareness. This means understanding how your breasts normally appear and feel.  It also means doing regular breast self-exams. Let your health care provider know about any changes, no matter how small.  If you are in your 20s or 30s, you should have a clinical breast exam (CBE) by a health care provider every 1-3 years as part of a regular health exam.  If you are 4 or older, have a CBE every  year. Also consider having a breast X-ray (mammogram) every year.  If you have a family history of breast cancer, talk to your health care provider about genetic screening.  If you are at high risk for breast cancer, talk to your health care provider about having an MRI and a mammogram every year.  Breast cancer gene (BRCA) assessment is recommended for women who have family members with BRCA-related cancers. BRCA-related cancers include:  Breast.  Ovarian.  Tubal.  Peritoneal cancers.  Results of  the assessment will determine the need for genetic counseling and BRCA1 and BRCA2 testing. Cervical Cancer  Your health care provider may recommend that you be screened regularly for cancer of the pelvic organs (ovaries, uterus, and vagina). This screening involves a pelvic examination, including checking for microscopic changes to the surface of your cervix (Pap test). You may be encouraged to have this screening done every 3 years, beginning at age 88.  For women ages 49-65, health care providers may recommend pelvic exams and Pap testing every 3 years, or they may recommend the Pap and pelvic exam, combined with testing for human papilloma virus (HPV), every 5 years. Some types of HPV increase your risk of cervical cancer. Testing for HPV may also be done on women of any age with unclear Pap test results.  Other health care providers may not recommend any screening for nonpregnant women who are considered low risk for pelvic cancer and who do not have symptoms. Ask your health care provider if a screening pelvic exam is right for you.  If you have had past treatment for cervical cancer or a condition that could lead to cancer, you need Pap tests and screening for cancer for at least 20 years after your treatment. If Pap tests have been discontinued, your risk factors (such as having a new sexual partner) need to be reassessed to determine if screening should resume. Some women have medical problems that increase the chance of getting cervical cancer. In these cases, your health care provider may recommend more frequent screening and Pap tests. Colorectal Cancer  This type of cancer can be detected and often prevented.  Routine colorectal cancer screening usually begins at 40 years of age and continues through 40 years of age.  Your health care provider may recommend screening at an earlier age if you have risk factors for colon cancer.  Your health care provider may also recommend using home test  kits to check for hidden blood in the stool.  A small camera at the end of a tube can be used to examine your colon directly (sigmoidoscopy or colonoscopy). This is done to check for the earliest forms of colorectal cancer.  Routine screening usually begins at age 33.  Direct examination of the colon should be repeated every 5-10 years through 40 years of age. However, you may need to be screened more often if early forms of precancerous polyps or small growths are found. Skin Cancer  Check your skin from head to toe regularly.  Tell your health care provider about any new moles or changes in moles, especially if there is a change in a mole's shape or color.  Also tell your health care provider if you have a mole that is larger than the size of a pencil eraser.  Always use sunscreen. Apply sunscreen liberally and repeatedly throughout the day.  Protect yourself by wearing long sleeves, pants, a wide-brimmed hat, and sunglasses whenever you are outside. Heart disease, diabetes, and high blood pressure  High blood pressure causes heart disease and increases the risk of stroke. High blood pressure is more likely to develop in:  People who have blood pressure in the high end of the normal range (130-139/85-89 mm Hg).  People who are overweight or obese.  People who are African American.  If you are 36-29 years of age, have your blood pressure checked every 3-5 years. If you are 71 years of age or older, have your blood pressure checked every year. You should have your blood pressure measured twice-once when you are at a hospital or clinic, and once when you are not at a hospital or clinic. Record the average of the two measurements. To check your blood pressure when you are not at a hospital or clinic, you can use:  An automated blood pressure machine at a pharmacy.  A home blood pressure monitor.  If you are between 23 years and 75 years old, ask your health care provider if you should  take aspirin to prevent strokes.  Have regular diabetes screenings. This involves taking a blood sample to check your fasting blood sugar level.  If you are at a normal weight and have a low risk for diabetes, have this test once every three years after 40 years of age.  If you are overweight and have a high risk for diabetes, consider being tested at a younger age or more often. Preventing infection Hepatitis B  If you have a higher risk for hepatitis B, you should be screened for this virus. You are considered at high risk for hepatitis B if:  You were born in a country where hepatitis B is common. Ask your health care provider which countries are considered high risk.  Your parents were born in a high-risk country, and you have not been immunized against hepatitis B (hepatitis B vaccine).  You have HIV or AIDS.  You use needles to inject street drugs.  You live with someone who has hepatitis B.  You have had sex with someone who has hepatitis B.  You get hemodialysis treatment.  You take certain medicines for conditions, including cancer, organ transplantation, and autoimmune conditions. Hepatitis C  Blood testing is recommended for:  Everyone born from [birth year] through 1965.  Anyone with known risk factors for hepatitis C. Sexually transmitted infections (STIs)  You should be screened for sexually transmitted infections (STIs) including gonorrhea and chlamydia if:  You are sexually active and are younger than 40 years of age.  You are older than 40 years of age and your health care provider tells you that you are at risk for this type of infection.  Your sexual activity has changed since you were last screened and you are at an increased risk for chlamydia or gonorrhea. Ask your health care provider if you are at risk.  If you do not have HIV, but are at risk, it may be recommended that you take a prescription medicine daily to prevent HIV infection. This is called  pre-exposure prophylaxis (PrEP). You are considered at risk if:  You are sexually active and do not regularly use condoms or know the HIV status of your partner(s).  You take drugs by injection.  You are sexually active with a partner who has HIV. Talk with your health care provider about whether you are at high risk of being infected with HIV. If you choose to begin PrEP, you should first be tested for HIV. You should then be tested every 3 months for as long  as you are taking PrEP. Pregnancy  If you are premenopausal and you may become pregnant, ask your health care provider about preconception counseling.  If you may become pregnant, take 400 to 800 micrograms (mcg) of folic acid every day.  If you want to prevent pregnancy, talk to your health care provider about birth control (contraception). Osteoporosis and menopause  Osteoporosis is a disease in which the bones lose minerals and strength with aging. This can result in serious bone fractures. Your risk for osteoporosis can be identified using a bone density scan.  If you are 86 years of age or older, or if you are at risk for osteoporosis and fractures, ask your health care provider if you should be screened.  Ask your health care provider whether you should take a calcium or vitamin D supplement to lower your risk for osteoporosis.  Menopause may have certain physical symptoms and risks.  Hormone replacement therapy may reduce some of these symptoms and risks. Talk to your health care provider about whether hormone replacement therapy is right for you. Follow these instructions at home:  Schedule regular health, dental, and eye exams.  Stay current with your immunizations.  Do not use any tobacco products including cigarettes, chewing tobacco, or electronic cigarettes.  If you are pregnant, do not drink alcohol.  If you are breastfeeding, limit how much and how often you drink alcohol.  Limit alcohol intake to no more  than 1 drink per day for nonpregnant women. One drink equals 12 ounces of beer, 5 ounces of wine, or 1 ounces of hard liquor.  Do not use street drugs.  Do not share needles.  Ask your health care provider for help if you need support or information about quitting drugs.  Tell your health care provider if you often feel depressed.  Tell your health care provider if you have ever been abused or do not feel safe at home. This information is not intended to replace advice given to you by your health care provider. Make sure you discuss any questions you have with your health care provider. Document Released: 04/14/2011 Document Revised: 03/06/2016 Document Reviewed: 07/03/2015  2017 [employer]

## 2017-03-25 ENCOUNTER — Other Ambulatory Visit: Payer: Self-pay | Admitting: Internal Medicine

## 2017-03-25 DIAGNOSIS — Z1231 Encounter for screening mammogram for malignant neoplasm of breast: Secondary | ICD-10-CM

## 2017-04-20 ENCOUNTER — Ambulatory Visit
Admission: RE | Admit: 2017-04-20 | Discharge: 2017-04-20 | Disposition: A | Discharge status: Home / Self Care | Payer: 59 | Source: Ambulatory Visit | Attending: Internal Medicine | Admitting: Internal Medicine

## 2017-04-20 DIAGNOSIS — Z1231 Encounter for screening mammogram for malignant neoplasm of breast: Secondary | ICD-10-CM | POA: Diagnosis not present

## 2017-04-20 DIAGNOSIS — I10 Essential (primary) hypertension: Secondary | ICD-10-CM | POA: Diagnosis not present

## 2017-04-20 DIAGNOSIS — D649 Anemia, unspecified: Secondary | ICD-10-CM | POA: Diagnosis not present

## 2017-04-20 DIAGNOSIS — E039 Hypothyroidism, unspecified: Secondary | ICD-10-CM | POA: Diagnosis not present

## 2017-04-20 DIAGNOSIS — Z79899 Other long term (current) drug therapy: Secondary | ICD-10-CM | POA: Diagnosis not present

## 2017-04-20 DIAGNOSIS — F325 Major depressive disorder, single episode, in full remission: Secondary | ICD-10-CM | POA: Diagnosis not present

## 2017-06-19 DIAGNOSIS — F900 Attention-deficit hyperactivity disorder, predominantly inattentive type: Secondary | ICD-10-CM | POA: Diagnosis not present

## 2017-06-19 DIAGNOSIS — E039 Hypothyroidism, unspecified: Secondary | ICD-10-CM | POA: Diagnosis not present

## 2017-06-19 DIAGNOSIS — Z79899 Other long term (current) drug therapy: Secondary | ICD-10-CM | POA: Diagnosis not present

## 2017-06-19 DIAGNOSIS — I10 Essential (primary) hypertension: Secondary | ICD-10-CM | POA: Diagnosis not present

## 2017-06-24 ENCOUNTER — Ambulatory Visit: Payer: Self-pay | Admitting: Physician Assistant

## 2017-06-24 ENCOUNTER — Encounter: Payer: Self-pay | Admitting: Physician Assistant

## 2017-06-24 VITALS — BP 132/84 | HR 69 | Temp 98.4°F

## 2017-06-24 DIAGNOSIS — J069 Acute upper respiratory infection, unspecified: Secondary | ICD-10-CM

## 2017-06-24 MED ORDER — AZITHROMYCIN 250 MG PO TABS: ORAL_TABLET | ORAL | 0 refills | Status: AC

## 2017-06-24 MED ORDER — METHYLPREDNISOLONE 4 MG PO TBPK: ORAL_TABLET | ORAL | 0 refills | Status: AC

## 2017-06-24 NOTE — Progress Notes (Signed)
S: C/o cough, sore throat, runny nose and congestion for 3 days, ? fever, chills, denies cp/sob, v/d; mucus is cloudy, feels like its moving into her chest    Using otc meds:   O: PE: vitals wnl, na,d perrl eomi, normocephalic, tms dull, nasal mucosa red and swollen, throat injected, neck supple no lymph, lungs c t a, cv rrr, neuro intact  A:  Acute  uri   P: drink fluids, continue regular meds , use otc meds of choice, return if not improving in 5 days, return earlier if worsening , medrol dose, zpack

## 2017-07-08 DIAGNOSIS — Z9884 Bariatric surgery status: Secondary | ICD-10-CM | POA: Diagnosis not present

## 2017-07-08 DIAGNOSIS — E162 Hypoglycemia, unspecified: Secondary | ICD-10-CM | POA: Diagnosis not present

## 2017-07-24 DIAGNOSIS — K912 Postsurgical malabsorption, not elsewhere classified: Secondary | ICD-10-CM | POA: Diagnosis not present

## 2017-07-24 DIAGNOSIS — E162 Hypoglycemia, unspecified: Secondary | ICD-10-CM | POA: Diagnosis not present

## 2017-07-24 DIAGNOSIS — Z23 Encounter for immunization: Secondary | ICD-10-CM | POA: Diagnosis not present

## 2017-07-24 DIAGNOSIS — Z9884 Bariatric surgery status: Secondary | ICD-10-CM | POA: Diagnosis not present

## 2017-08-05 DIAGNOSIS — Z23 Encounter for immunization: Secondary | ICD-10-CM | POA: Diagnosis not present

## 2017-08-10 DIAGNOSIS — Z23 Encounter for immunization: Secondary | ICD-10-CM | POA: Diagnosis not present

## 2017-08-21 DIAGNOSIS — E162 Hypoglycemia, unspecified: Secondary | ICD-10-CM | POA: Diagnosis not present

## 2017-08-21 DIAGNOSIS — Z23 Encounter for immunization: Secondary | ICD-10-CM | POA: Diagnosis not present

## 2017-09-18 DIAGNOSIS — Z0189 Encounter for other specified special examinations: Secondary | ICD-10-CM | POA: Diagnosis not present

## 2017-10-09 NOTE — Progress Notes (Deleted)
[facility] Patient ID: Robin Stone, female   DOB: [birth year], 41 y.o.   MRN: [id number] Annual visit. TVH with Bilateral salpingectomy and cystoscopy (Leiomyoma; B9 Paratubal cysts)  04/01/2014 Gastric By-Pass Tubal ligation G2 P2002 (C/S) FHX: Diabetes: father BMI: 27  ANNUAL PREVENTATIVE CARE GYN  ENCOUNTER NOTE  Subjective:       Robin Stone is a 41 y.o. 232P2002 female here for a routine annual gynecologic exam.  Current complaints: 1.  None  Status post TVH, bilateral salpingectomy and cystoscopy in 04/01/2014 for uterine fibroid and benign paratubal cysts.  Patient also has history of gastric bypass, cesarean section 2.  Has Gained 13 pounds in the past year. No current active medical problems other than hypothyroidism which is monitored by Dr. Judithann SheenSparks   Gynecologic History No LMP recorded. Patient has had a hysterectomy. Contraception: TVH, bilateral salpingectomy Last Pap:no further Paps needed Last mammogram: 04/2017 birad 1  Obstetric History OB History  Gravida Para Term Preterm AB Living  2 2 2     2   SAB TAB Ectopic Multiple Live Births          2    # Outcome Date GA Lbr Len/2nd Weight Sex Delivery Anes PTL Lv  2 Term     M CS-LTranv   LIV  1 Term     M CS-LTranv   LIV      Past Medical History:  Diagnosis Date  . Anemia   . History of total vaginal hysterectomy (TVH) 04/02/2015   TVH, bilateral salpingectomy  . Hypothyroidism     Past Surgical History:  Procedure Laterality Date  . AUGMENTATION MAMMAPLASTY Bilateral    implant and areola reduction  . CESAREAN SECTION    . CHOLECYSTECTOMY    . DILATION AND CURETTAGE OF UTERUS    . GASTRIC BYPASS  2010  . TUBAL LIGATION    . VAGINAL HYSTERECTOMY  03/2014   with bilateal salpingectomy - leiomyoma    Current Outpatient Medications on File Prior to Visit  Medication Sig Dispense Refill  . amitriptyline (ELAVIL) 25 MG tablet Take 25 mg by mouth at bedtime.    Marland Kitchen. azithromycin (ZITHROMAX Z-PAK) 250 MG  tablet 2 pills today then 1 pill a day for 4 days 6 each 0  . cetirizine (ZYRTEC) 10 MG tablet Take 10 mg by mouth daily.    . ferrous sulfate 325 (65 FE) MG tablet Take 325 mg by mouth daily with breakfast. Reported on 10/02/2015    . hydrochlorothiazide (HYDRODIURIL) 25 MG tablet Take by mouth.    Marland Kitchen. ibuprofen (ADVIL,MOTRIN) 200 MG tablet Take 200 mg by mouth every 6 (six) hours as needed.    Marland Kitchen. levothyroxine (SYNTHROID, LEVOTHROID) 125 MCG tablet Take by mouth.    . methylPREDNISolone (MEDROL DOSEPAK) 4 MG TBPK tablet Take 6 pills on day one then decrease by 1 pill each day 21 tablet 0  . naproxen (NAPROSYN) 500 MG tablet Take 1 tablet (500 mg total) by mouth 2 (two) times daily with a meal. (Patient not taking: Reported on 06/24/2017) 14 tablet 0  . traZODone (DESYREL) 100 MG tablet Take 100 mg by mouth at bedtime.     No current facility-administered medications on file prior to visit.     Allergies  Allergen Reactions  . Sulfa Antibiotics     Social History   Socioeconomic History  . Marital status: Married    Spouse name: Not on file  . Number of children: Not on file  .  Years of education: Not on file  . Highest education level: Not on file  Social Needs  . Financial resource strain: Not on file  . Food insecurity - worry: Not on file  . Food insecurity - inability: Not on file  . Transportation needs - medical: Not on file  . Transportation needs - non-medical: Not on file  Occupational History  . Not on file  Tobacco Use  . Smoking status: Never Smoker  . Smokeless tobacco: Never Used  Substance and Sexual Activity  . Alcohol use: No  . Drug use: No  . Sexual activity: Yes    Birth control/protection: Surgical  Other Topics Concern  . Not on file  Social History Narrative  . Not on file    Family History  Problem Relation Age of Onset  . Diabetes Father   . Heart disease Neg Hx   . Cancer Neg Hx   . Breast cancer Neg Hx     The following portions of the  patient's history were reviewed and updated as appropriate: allergies, current medications, past family history, past medical history, past social history, past surgical history and problem list.  Review of Systems ROS   Objective:   There were no vitals taken for this visit. CONSTITUTIONAL: Well-developed, well-nourished female in no acute distress.  PSYCHIATRIC: Normal mood and affect. Normal behavior. Normal judgment and thought content. NEUROLGIC: Alert and oriented to person, place, and time. Normal muscle tone coordination. No cranial nerve deficit noted. HENT:  Normocephalic, atraumatic, External right and left ear normal. Oropharynx is clear and moist EYES: Conjunctivae and EOM are normal. Pupils are equal, round, and reactive to light. No scleral icterus.  NECK: Normal range of motion, supple, no masses.  Normal thyroid.  SKIN: Skin is warm and dry. No rash noted. Not diaphoretic. No erythema. No pallor. CARDIOVASCULAR: Normal heart rate noted, regular rhythm, no murmur. RESPIRATORY: Clear to auscultation bilaterally. Effort and breath sounds normal, no problems with respiration noted. BREASTS: Symmetric in size. No masses, skin changes, nipple drainage, or lymphadenopathy. Mammoplasty scars healed. ABDOMEN: Soft, normal bowel sounds, no distention noted.  No tenderness, rebound or guarding.  BLADDER: Normal PELVIC:  External Genitalia: Normal  BUS: Normal  Vagina: Normal with good vault support  Cervix: surgically absent  Uterus: surgically absent  Adnexa: Normal  RV: External Exam NormaI  MUSCULOSKELETAL: Normal range of motion. No tenderness.  No cyanosis, clubbing, or edema.  2+ distal pulses. LYMPHATIC: No Axillary, Supraclavicular, or Inguinal Adenopathy.    Assessment:   Annual gynecologic examination 41 y.o. Contraception: status post TVH, bilateral salpingectomy  BMI 27  Weight gain   Plan:  Pap: Not needed Mammogram:utd Stool Guaiac Testing:  Not  Indicated Labs: per Dr. Judithann SheenSparks Routine preventative health maintenance measures emphasized: Exercise/Diet/Weight control, Tobacco Warnings and Alcohol/Substance use risks Return to Clinic - 1 Year   Darol Destinerystal Sabra Sessler, [place]  Note: This dictation was prepared with Dragon dictation along with smaller phrase technology. Any transcriptional errors that result from this process are unintentional.

## 2017-10-14 ENCOUNTER — Encounter: Payer: 59 | Admitting: Obstetrics and Gynecology

## 2017-10-15 DIAGNOSIS — E162 Hypoglycemia, unspecified: Secondary | ICD-10-CM | POA: Diagnosis not present

## 2017-10-15 DIAGNOSIS — E039 Hypothyroidism, unspecified: Secondary | ICD-10-CM | POA: Diagnosis not present

## 2017-10-15 DIAGNOSIS — F419 Anxiety disorder, unspecified: Secondary | ICD-10-CM | POA: Diagnosis not present

## 2017-10-15 DIAGNOSIS — I10 Essential (primary) hypertension: Secondary | ICD-10-CM | POA: Diagnosis not present

## 2018-02-15 DIAGNOSIS — I10 Essential (primary) hypertension: Secondary | ICD-10-CM | POA: Diagnosis not present

## 2018-02-15 DIAGNOSIS — E162 Hypoglycemia, unspecified: Secondary | ICD-10-CM | POA: Diagnosis not present

## 2018-02-15 DIAGNOSIS — E039 Hypothyroidism, unspecified: Secondary | ICD-10-CM | POA: Diagnosis not present

## 2018-02-15 DIAGNOSIS — F419 Anxiety disorder, unspecified: Secondary | ICD-10-CM | POA: Diagnosis not present

## 2018-02-25 DIAGNOSIS — H52223 Regular astigmatism, bilateral: Secondary | ICD-10-CM | POA: Diagnosis not present

## 2018-02-25 DIAGNOSIS — H5211 Myopia, right eye: Secondary | ICD-10-CM | POA: Diagnosis not present

## 2018-02-25 DIAGNOSIS — H524 Presbyopia: Secondary | ICD-10-CM | POA: Diagnosis not present

## 2018-03-15 DIAGNOSIS — E162 Hypoglycemia, unspecified: Secondary | ICD-10-CM | POA: Diagnosis not present

## 2018-03-15 DIAGNOSIS — Z79899 Other long term (current) drug therapy: Secondary | ICD-10-CM | POA: Diagnosis not present

## 2018-03-15 DIAGNOSIS — I10 Essential (primary) hypertension: Secondary | ICD-10-CM | POA: Diagnosis not present

## 2018-03-15 DIAGNOSIS — E7800 Pure hypercholesterolemia, unspecified: Secondary | ICD-10-CM | POA: Diagnosis not present

## 2018-04-13 ENCOUNTER — Other Ambulatory Visit: Payer: Self-pay | Admitting: Unknown Physician Specialty

## 2018-04-13 DIAGNOSIS — R221 Localized swelling, mass and lump, neck: Secondary | ICD-10-CM

## 2018-04-19 ENCOUNTER — Ambulatory Visit
Admission: RE | Admit: 2018-04-19 | Discharge: 2018-04-19 | Disposition: A | Discharge status: Home / Self Care | Payer: 59 | Source: Ambulatory Visit | Attending: Unknown Physician Specialty | Admitting: Unknown Physician Specialty

## 2018-04-19 DIAGNOSIS — R221 Localized swelling, mass and lump, neck: Secondary | ICD-10-CM | POA: Insufficient documentation

## 2018-05-25 DIAGNOSIS — E039 Hypothyroidism, unspecified: Secondary | ICD-10-CM | POA: Diagnosis not present

## 2018-05-25 DIAGNOSIS — F419 Anxiety disorder, unspecified: Secondary | ICD-10-CM | POA: Diagnosis not present

## 2018-05-25 DIAGNOSIS — F325 Major depressive disorder, single episode, in full remission: Secondary | ICD-10-CM | POA: Diagnosis not present

## 2018-05-25 DIAGNOSIS — I10 Essential (primary) hypertension: Secondary | ICD-10-CM | POA: Diagnosis not present

## 2018-06-30 ENCOUNTER — Other Ambulatory Visit: Payer: Self-pay | Admitting: Internal Medicine

## 2018-06-30 DIAGNOSIS — Z1231 Encounter for screening mammogram for malignant neoplasm of breast: Secondary | ICD-10-CM

## 2018-07-12 ENCOUNTER — Ambulatory Visit
Admission: RE | Admit: 2018-07-12 | Discharge: 2018-07-12 | Disposition: A | Discharge status: Home / Self Care | Payer: 59 | Source: Ambulatory Visit | Attending: Internal Medicine | Admitting: Internal Medicine

## 2018-07-12 DIAGNOSIS — Z1231 Encounter for screening mammogram for malignant neoplasm of breast: Secondary | ICD-10-CM | POA: Insufficient documentation

## 2018-07-15 ENCOUNTER — Other Ambulatory Visit: Payer: Self-pay | Admitting: Internal Medicine

## 2018-07-15 DIAGNOSIS — R921 Mammographic calcification found on diagnostic imaging of breast: Secondary | ICD-10-CM

## 2018-07-15 DIAGNOSIS — R928 Other abnormal and inconclusive findings on diagnostic imaging of breast: Secondary | ICD-10-CM

## 2018-07-16 ENCOUNTER — Ambulatory Visit
Admission: RE | Admit: 2018-07-16 | Discharge: 2018-07-16 | Disposition: A | Discharge status: Home / Self Care | Payer: 59 | Source: Ambulatory Visit | Attending: Internal Medicine | Admitting: Internal Medicine

## 2018-07-16 DIAGNOSIS — R921 Mammographic calcification found on diagnostic imaging of breast: Secondary | ICD-10-CM

## 2018-07-16 DIAGNOSIS — R928 Other abnormal and inconclusive findings on diagnostic imaging of breast: Secondary | ICD-10-CM | POA: Diagnosis not present

## 2018-08-24 DIAGNOSIS — E039 Hypothyroidism, unspecified: Secondary | ICD-10-CM | POA: Diagnosis not present

## 2018-08-24 DIAGNOSIS — F909 Attention-deficit hyperactivity disorder, unspecified type: Secondary | ICD-10-CM | POA: Diagnosis not present

## 2018-08-25 DIAGNOSIS — Z111 Encounter for screening for respiratory tuberculosis: Secondary | ICD-10-CM | POA: Diagnosis not present

## 2018-10-14 IMAGING — MG MM DIGITAL DIAGNOSTIC UNILAT*L* W/ IMPLANTS
4 series · 4 of 4 positions shown · non-contrast
Comparison: 07/12/2018, 04/20/2017 and earlier.

CLINICAL DATA: Recall from screening mammography with
tomosynthesis, calcifications involving the UPPER OUTER QUADRANT of
the LEFT breast at MIDDLE depth. Patient has indwelling BILATERAL
retropectoral saline implants.

EXAM:
DIGITAL DIAGNOSTIC LEFT MAMMOGRAM WITH CAD

[L ML (1 of 3)]
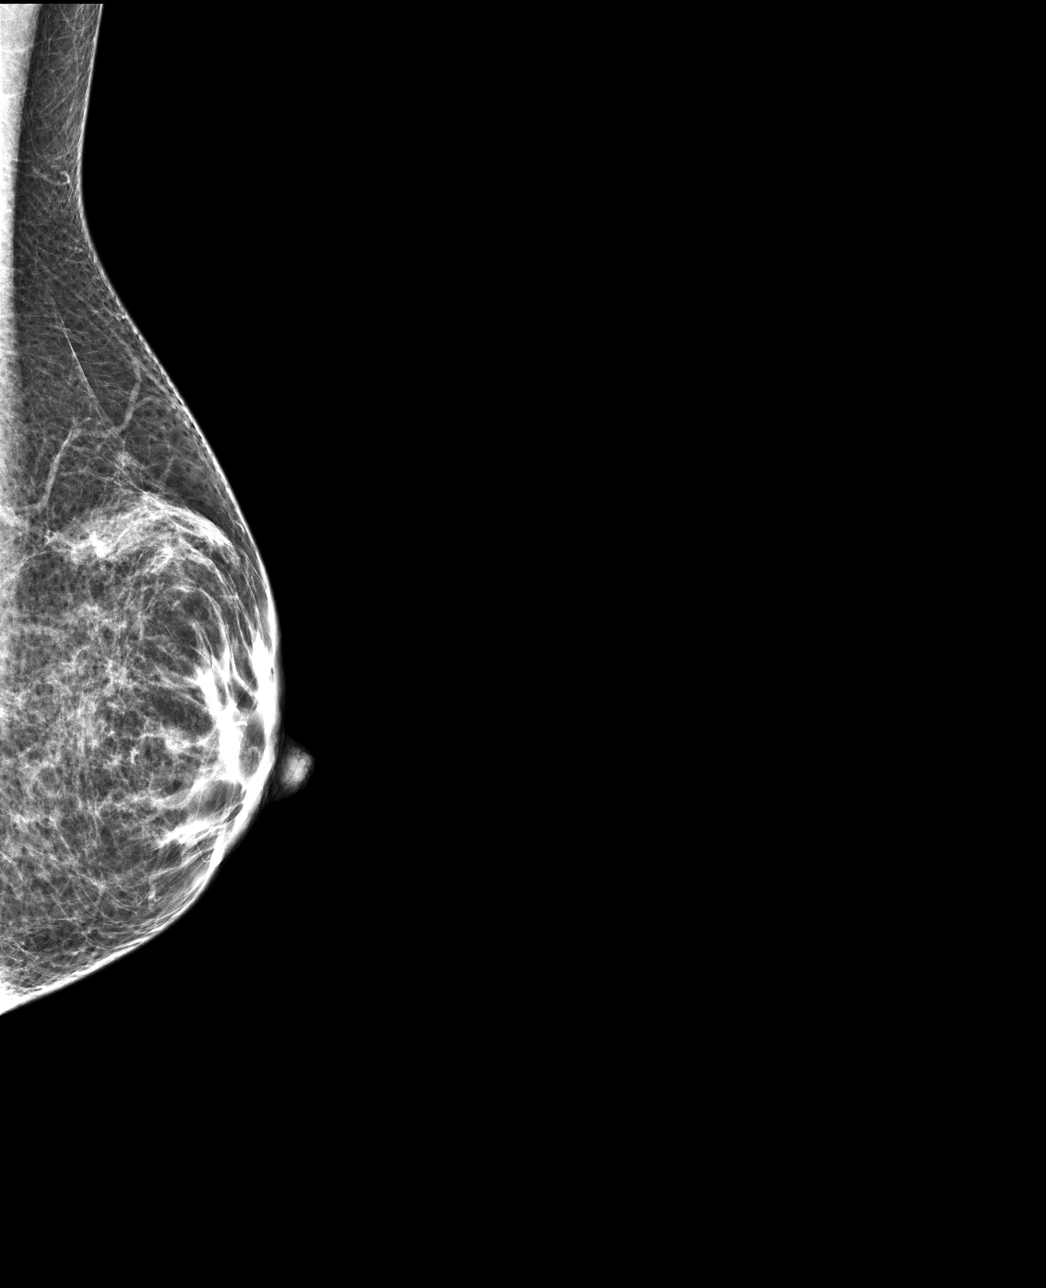

[L ML (2 of 3)]
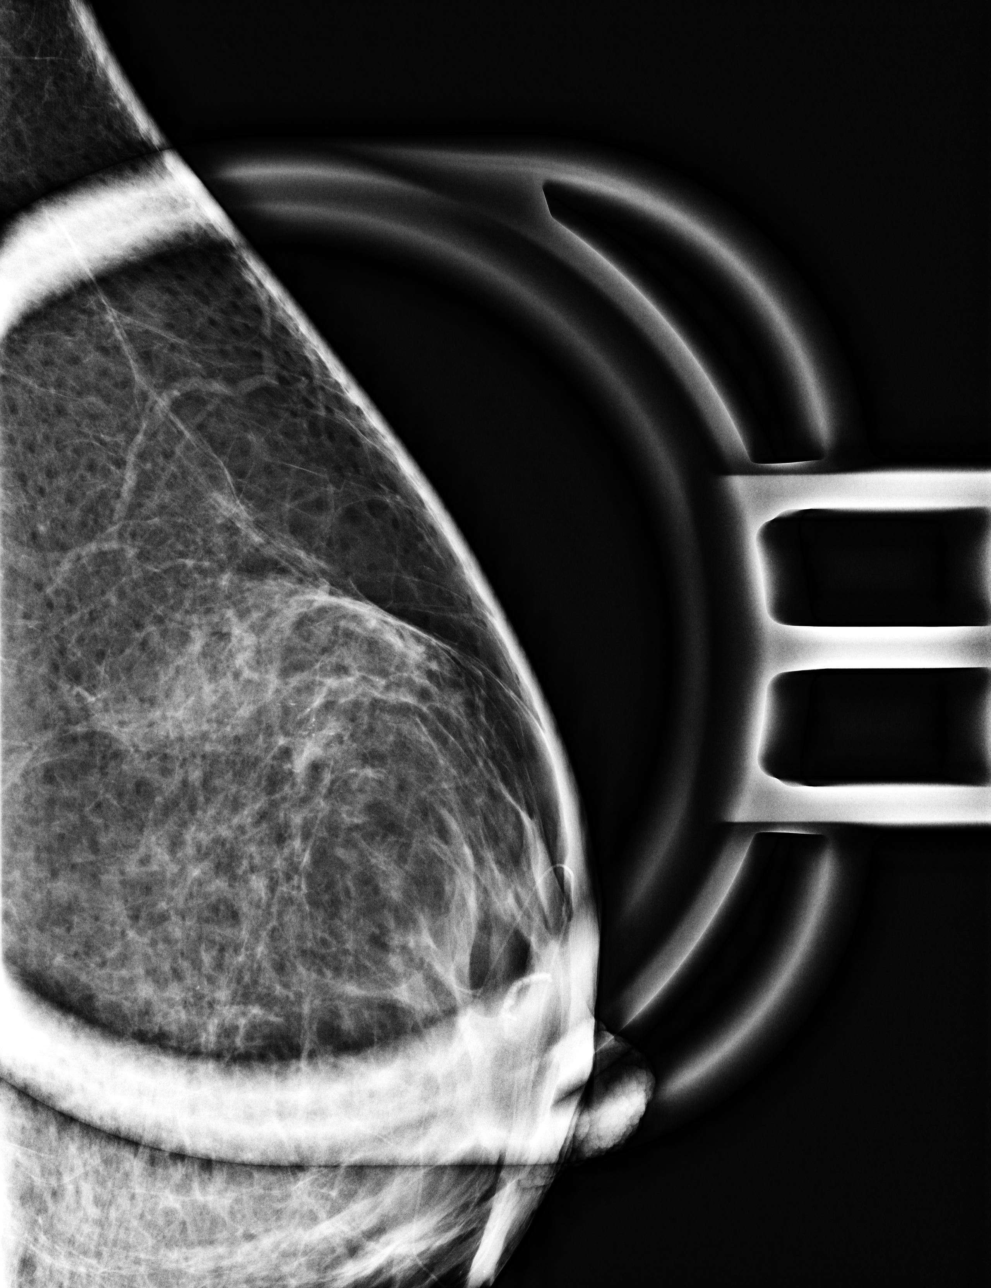

[L ML (3 of 3)]
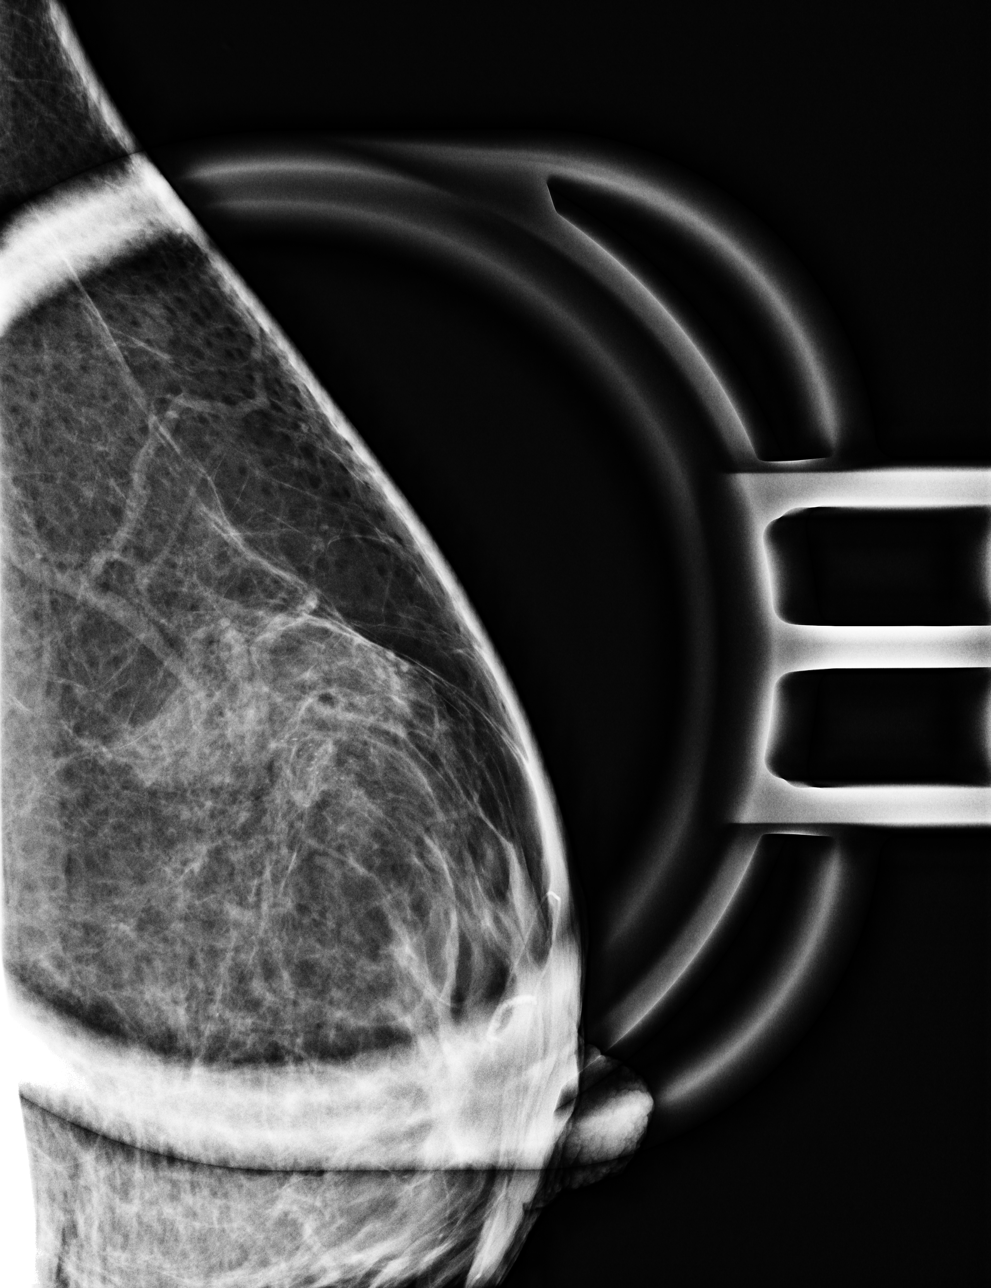

[L CC]
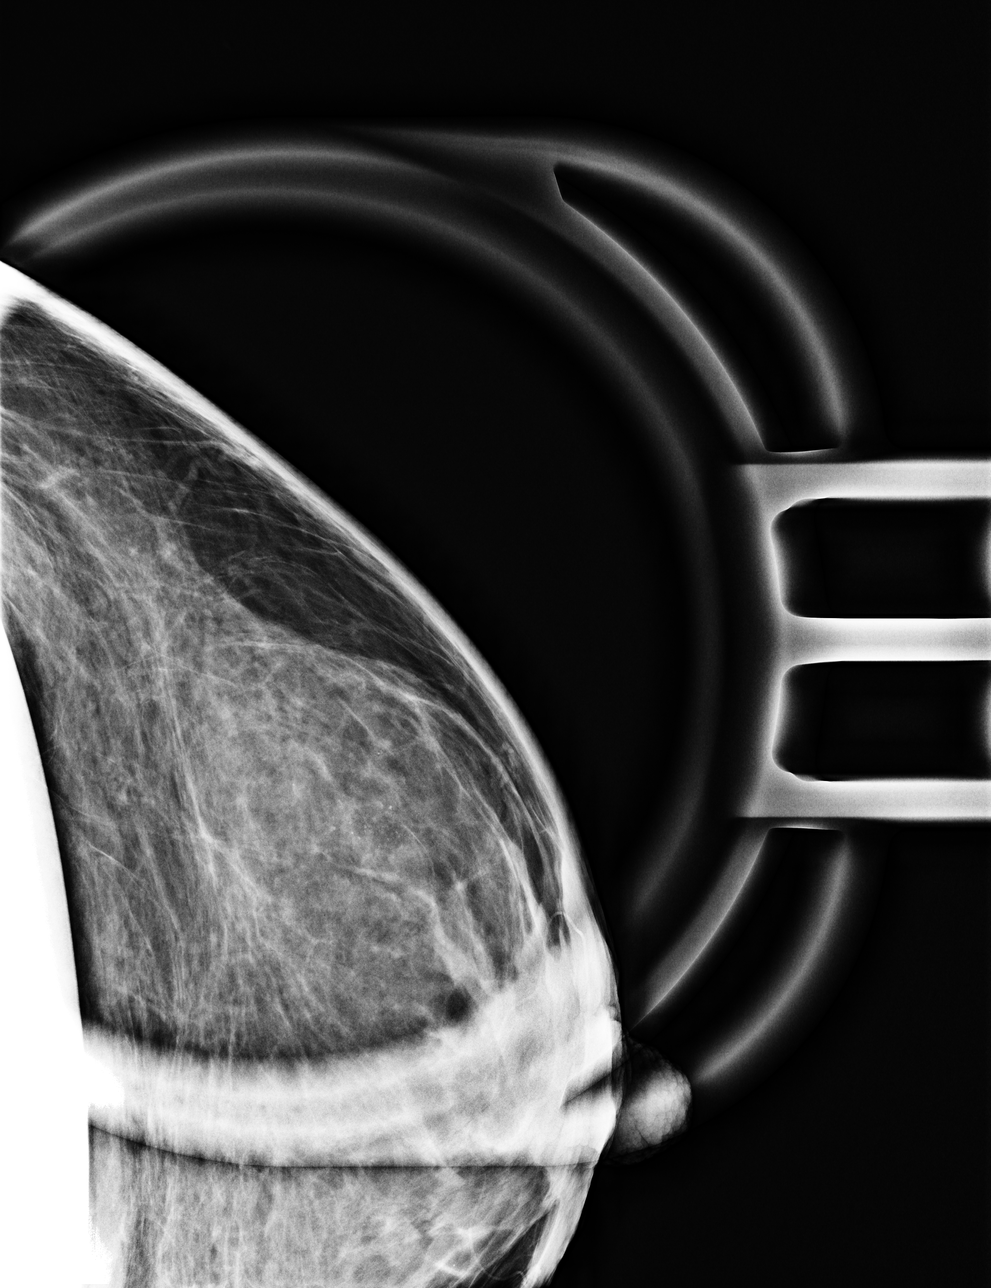

[4 of 4 positions shown; findings below may reference images not displayed]

ACR Breast Density Category b: There are scattered areas of
fibroglandular density.
FINDINGS: Standard spot magnification implant displaced CC and mediolateral
views of the LEFT breast calcifications and a standard 2D full field
implant displaced mediolateral view of the LEFT breast were
obtained.

Spot magnification images confirm a loose group of calcifications in
the UPPER OUTER QUADRANT at MIDDLE depth which are predominantly
punctate in morphology on the CC image, many of which demonstrate
layering on the mediolateral image. The calcifications span
approximately 12 mm. There are no suspicious linear or branching
forms.

The full field mediolateral image was processed with CAD.
IMPRESSION: Likely benign approximate 12 mm group of calcifications involving
the UPPER OUTER QUADRANT of the LEFT breast at MIDDLE depth, some of
which represent benign milk of calcium.

RECOMMENDATION:
Diagnostic LEFT mammogram in 6 months.

I have discussed the findings and recommendations with the patient.
Results were also provided in writing at the conclusion of the
visit. If applicable, a reminder letter will be sent to the patient
regarding the next appointment.

BI-RADS CATEGORY  3: Probably benign.

## 2018-12-17 ENCOUNTER — Other Ambulatory Visit: Payer: Self-pay | Admitting: Internal Medicine

## 2018-12-17 DIAGNOSIS — R921 Mammographic calcification found on diagnostic imaging of breast: Secondary | ICD-10-CM

## 2019-01-17 ENCOUNTER — Ambulatory Visit
Admission: RE | Admit: 2019-01-17 | Discharge: 2019-01-17 | Disposition: A | Discharge status: Home / Self Care | Payer: BLUE CROSS/BLUE SHIELD | Source: Ambulatory Visit | Attending: Internal Medicine | Admitting: Internal Medicine

## 2019-01-17 ENCOUNTER — Other Ambulatory Visit: Payer: Self-pay

## 2019-01-17 DIAGNOSIS — R921 Mammographic calcification found on diagnostic imaging of breast: Secondary | ICD-10-CM

## 2019-09-20 ENCOUNTER — Other Ambulatory Visit: Payer: Self-pay | Admitting: Internal Medicine

## 2019-09-22 ENCOUNTER — Other Ambulatory Visit: Payer: Self-pay | Admitting: Internal Medicine

## 2019-09-22 DIAGNOSIS — R921 Mammographic calcification found on diagnostic imaging of breast: Secondary | ICD-10-CM

## 2019-09-26 ENCOUNTER — Other Ambulatory Visit: Payer: Self-pay | Admitting: Neurology

## 2019-09-26 ENCOUNTER — Other Ambulatory Visit (HOSPITAL_COMMUNITY): Payer: Self-pay | Admitting: Neurology

## 2019-09-26 DIAGNOSIS — R4189 Other symptoms and signs involving cognitive functions and awareness: Secondary | ICD-10-CM

## 2019-10-05 ENCOUNTER — Ambulatory Visit
Admission: RE | Admit: 2019-10-05 | Discharge: 2019-10-05 | Disposition: A | Discharge status: Home / Self Care | Payer: BC Managed Care – PPO | Source: Ambulatory Visit | Attending: Internal Medicine | Admitting: Internal Medicine

## 2019-10-05 ENCOUNTER — Other Ambulatory Visit: Payer: Self-pay | Admitting: Internal Medicine

## 2019-10-05 DIAGNOSIS — R921 Mammographic calcification found on diagnostic imaging of breast: Secondary | ICD-10-CM | POA: Diagnosis not present

## 2019-10-11 ENCOUNTER — Ambulatory Visit (HOSPITAL_COMMUNITY): Admission: RE | Admit: 2019-10-11 | Payer: BC Managed Care – PPO | Source: Ambulatory Visit

## 2019-10-11 ENCOUNTER — Encounter (HOSPITAL_COMMUNITY): Payer: Self-pay

## 2020-07-03 ENCOUNTER — Other Ambulatory Visit: Payer: Self-pay | Admitting: Internal Medicine

## 2020-07-17 ENCOUNTER — Other Ambulatory Visit: Payer: Self-pay | Admitting: Family Medicine

## 2020-07-17 DIAGNOSIS — M79671 Pain in right foot: Secondary | ICD-10-CM | POA: Diagnosis not present

## 2020-07-17 DIAGNOSIS — M19071 Primary osteoarthritis, right ankle and foot: Secondary | ICD-10-CM | POA: Diagnosis not present

## 2020-07-17 DIAGNOSIS — F325 Major depressive disorder, single episode, in full remission: Secondary | ICD-10-CM | POA: Diagnosis not present

## 2020-07-17 DIAGNOSIS — M7731 Calcaneal spur, right foot: Secondary | ICD-10-CM | POA: Diagnosis not present

## 2020-07-20 ENCOUNTER — Other Ambulatory Visit: Payer: Self-pay | Admitting: Podiatry

## 2020-07-20 DIAGNOSIS — M7731 Calcaneal spur, right foot: Secondary | ICD-10-CM | POA: Diagnosis not present

## 2020-07-20 DIAGNOSIS — M722 Plantar fascial fibromatosis: Secondary | ICD-10-CM | POA: Diagnosis not present

## 2020-08-10 ENCOUNTER — Other Ambulatory Visit: Payer: Self-pay | Admitting: Internal Medicine

## 2020-08-10 DIAGNOSIS — M722 Plantar fascial fibromatosis: Secondary | ICD-10-CM | POA: Diagnosis not present

## 2020-08-31 ENCOUNTER — Other Ambulatory Visit: Payer: Self-pay | Admitting: Internal Medicine

## 2020-08-31 DIAGNOSIS — I10 Essential (primary) hypertension: Secondary | ICD-10-CM | POA: Diagnosis not present

## 2020-08-31 DIAGNOSIS — E162 Hypoglycemia, unspecified: Secondary | ICD-10-CM | POA: Diagnosis not present

## 2020-08-31 DIAGNOSIS — Z13220 Encounter for screening for lipoid disorders: Secondary | ICD-10-CM | POA: Diagnosis not present

## 2020-08-31 DIAGNOSIS — E039 Hypothyroidism, unspecified: Secondary | ICD-10-CM | POA: Diagnosis not present

## 2020-08-31 DIAGNOSIS — F325 Major depressive disorder, single episode, in full remission: Secondary | ICD-10-CM | POA: Diagnosis not present

## 2020-08-31 DIAGNOSIS — Z79899 Other long term (current) drug therapy: Secondary | ICD-10-CM | POA: Diagnosis not present

## 2020-09-19 DIAGNOSIS — H524 Presbyopia: Secondary | ICD-10-CM | POA: Diagnosis not present

## 2020-09-27 ENCOUNTER — Other Ambulatory Visit: Payer: Self-pay | Admitting: Internal Medicine

## 2020-11-02 ENCOUNTER — Other Ambulatory Visit: Payer: Self-pay | Admitting: Family Medicine

## 2020-11-02 ENCOUNTER — Other Ambulatory Visit: Payer: Self-pay

## 2020-11-02 ENCOUNTER — Ambulatory Visit
Admission: EM | Admit: 2020-11-02 | Discharge: 2020-11-02 | Disposition: A | Discharge status: Home / Self Care | Payer: No Typology Code available for payment source | Attending: Family Medicine | Admitting: Family Medicine

## 2020-11-02 DIAGNOSIS — J0110 Acute frontal sinusitis, unspecified: Secondary | ICD-10-CM | POA: Diagnosis not present

## 2020-11-02 DIAGNOSIS — Z1152 Encounter for screening for COVID-19: Secondary | ICD-10-CM

## 2020-11-02 DIAGNOSIS — J069 Acute upper respiratory infection, unspecified: Secondary | ICD-10-CM

## 2020-11-02 DIAGNOSIS — J3489 Other specified disorders of nose and nasal sinuses: Secondary | ICD-10-CM

## 2020-11-02 MED ORDER — AMOXICILLIN 500 MG PO CAPS: 1000.0000 mg | ORAL_CAPSULE | Freq: Three times a day (TID) | ORAL | 0 refills | Status: DC

## 2020-11-02 MED ORDER — MUPIROCIN CALCIUM 2 % EX CREA: 1.0000 "application " | TOPICAL_CREAM | Freq: Two times a day (BID) | CUTANEOUS | 0 refills | Status: DC

## 2020-11-02 NOTE — Discharge Instructions (Signed)
Medicines as prescribed OTC medicines as needed  Follow up as needed for continued or worsening symptoms

## 2020-11-02 NOTE — ED Provider Notes (Signed)
Robin Stone    CSN: [id number] Arrival date & time: 11/02/20  4401      History   Chief Complaint Chief Complaint  Patient presents with  . Sore Throat    HPI Robin Stone is a 45 y.o. female.   Patient is a 45 year old female who presents today with complaints of sore throat, headache, cough, congestion, sinus pressure, fatigue.  Started on 1/15.  Symptoms have worsened.  Had negative COVID testing on 1/18.  Treating symptoms with Tylenol, DayQuil, Excedrin.     Past Medical History:  Diagnosis Date  . Anemia   . History of total vaginal hysterectomy (TVH) 04/02/2015   TVH, bilateral salpingectomy  . Hypothyroidism     Patient Active Problem List   Diagnosis Date Noted  . Weight gain following gastric bypass surgery 10/02/2016  . Absolute anemia 10/02/2015  . Anxiety 10/02/2015  . Benign hypertension 10/02/2015  . Hypothyroidism 10/02/2015  . Major depression in remission (HCC) 10/02/2015  . History of total vaginal hysterectomy (TVH) 04/02/2015    Past Surgical History:  Procedure Laterality Date  . AUGMENTATION MAMMAPLASTY Bilateral    implant and areola reduction  . CESAREAN SECTION    . CHOLECYSTECTOMY    . DILATION AND CURETTAGE OF UTERUS    . GASTRIC BYPASS  2010  . TUBAL LIGATION    . VAGINAL HYSTERECTOMY  03/2014   with bilateal salpingectomy - leiomyoma    OB History    Gravida  2   Para  2   Term  2   Preterm      AB      Living  2     SAB      IAB      Ectopic      Multiple      Live Births  2            Home Medications    Prior to Admission medications   Medication Sig Start Date End Date Taking? Authorizing Provider  amoxicillin (AMOXIL) 500 MG capsule Take 2 capsules (1,000 mg total) by mouth 3 (three) times daily for 5 days. 11/02/20 11/07/20 Yes Rithwik Schmieg A, NP  levothyroxine (SYNTHROID) 100 MCG tablet Take by mouth. 07/03/20  Yes [provider]  mupirocin cream (BACTROBAN) 2 % Apply 1  application topically 2 (two) times daily. 11/02/20  Yes Ervine Witucki A, NP  amitriptyline (ELAVIL) 25 MG tablet Take 25 mg by mouth at bedtime.    [provider]  azithromycin (ZITHROMAX Z-PAK) 250 MG tablet 2 pills today then 1 pill a day for 4 days 06/24/17   Sherrie Mustache Roselyn Bering, PA-C  cetirizine (ZYRTEC) 10 MG tablet Take 10 mg by mouth daily.    [provider]  ferrous sulfate 325 (65 FE) MG tablet Take 325 mg by mouth daily with breakfast. Reported on 10/02/2015    [provider]  hydrochlorothiazide (HYDRODIURIL) 25 MG tablet Take by mouth. 08/16/15   [provider]  ibuprofen (ADVIL,MOTRIN) 200 MG tablet Take 200 mg by mouth every 6 (six) hours as needed.    [provider]  MAGNESIUM PO Take by mouth.    [provider]  methylPREDNISolone (MEDROL DOSEPAK) 4 MG TBPK tablet Take 6 pills on day one then decrease by 1 pill each day 06/24/17   Faythe Ghee, PA-C  Multiple Vitamin (MULTI-VITAMIN) tablet Take 1 tablet by mouth daily.    [provider]  naproxen (NAPROSYN) 500 MG tablet Take  1 tablet (500 mg total) by mouth 2 (two) times daily with a meal. Patient not taking: Reported on 06/24/2017 05/12/16   Joni Reining, PA-C  traZODone (DESYREL) 100 MG tablet Take 100 mg by mouth at bedtime.    [provider]  VYVANSE 70 MG capsule Take 70 mg by mouth at bedtime. 10/11/20   [provider]    Family History Family History  Problem Relation Age of Onset  . Diabetes Father   . Healthy Mother   . Heart disease Neg Hx   . Cancer Neg Hx   . Breast cancer Neg Hx     Social History Social History   Tobacco Use  . Smoking status: Never Smoker  . Smokeless tobacco: Never Used  Substance Use Topics  . Alcohol use: No  . Drug use: No     Allergies   Sulfa antibiotics   Review of Systems Review of Systems   Physical Exam Triage Vital Signs ED Triage Vitals  Enc Vitals Group     BP 11/02/20  1008 (!) 144/94     Pulse Rate 11/02/20 1008 70     Resp 11/02/20 1008 18     Temp 11/02/20 1008 97.6 F (36.4 C)     Temp Source 11/02/20 1008 Oral     SpO2 11/02/20 1008 98 %     Weight --      Height --      Head Circumference --      Peak Flow --      Pain Score 11/02/20 1020 0     Pain Loc --      Pain Edu? --      Excl. in GC? --    No data found.  Updated Vital Signs BP (!) 144/94 (BP Location: Left Arm)   Pulse 70   Temp 97.6 F (36.4 C) (Oral)   Resp 18   SpO2 98%   Visual Acuity Right Eye Distance:   Left Eye Distance:   Bilateral Distance:    Right Eye Near:   Left Eye Near:    Bilateral Near:     Physical Exam Vitals and nursing note reviewed.  Constitutional:      General: She is not in acute distress.    Appearance: Normal appearance. She is ill-appearing. She is not toxic-appearing or diaphoretic.  HENT:     Head: Normocephalic.     Right Ear: Tympanic membrane and ear canal normal.     Left Ear: Tympanic membrane and ear canal normal.     Nose: Congestion present.     Right Turbinates: Swollen.     Left Turbinates: Swollen.     Right Sinus: Maxillary sinus tenderness present.     Left Sinus: Maxillary sinus tenderness present.     Comments: Sores noted in bilateral nare    Mouth/Throat:     Pharynx: Oropharynx is clear.  Eyes:     Conjunctiva/sclera: Conjunctivae normal.  Cardiovascular:     Rate and Rhythm: Normal rate and regular rhythm.  Pulmonary:     Effort: Pulmonary effort is normal.     Breath sounds: Normal breath sounds.  Musculoskeletal:        General: Normal range of motion.     Cervical back: Normal range of motion.  Skin:    General: Skin is warm and dry.     Findings: No rash.  Neurological:     Mental Status: She is alert.  Psychiatric:  Mood and Affect: Mood normal.      UC Treatments / Results  Labs (all labs ordered are listed, but only abnormal results are displayed) Labs Reviewed  NOVEL  CORONAVIRUS, NAA    EKG   Radiology No results found.  Procedures Procedures (including critical care time)  Medications Ordered in UC Medications - No data to display  Initial Impression / Assessment and Plan / UC Course  I have reviewed the triage vital signs and the nursing notes.  Pertinent labs & imaging results that were available during my care of the patient were reviewed by me and considered in my medical decision making (see chart for details).     Sinusitis Patient with worsening symptoms over the last week.  Patient with bilateral nasal sores Prescribing Bactroban for nasal sores.  Patient works in Teacher, music. Amoxicillin for sinus infection/upper respiratory infection. Continue over-the-counter medicines as needed for symptoms. Follow up as needed for continued or worsening symptoms  Final Clinical Impressions(s) / UC Diagnoses   Final diagnoses:  Acute non-recurrent frontal sinusitis  Nasal sore  Upper respiratory tract infection, unspecified type     Discharge Instructions     Medicines as prescribed OTC medicines as needed  Follow up as needed for continued or worsening symptoms     ED Prescriptions    Medication Sig Dispense Auth. Provider   mupirocin cream (BACTROBAN) 2 % Apply 1 application topically 2 (two) times daily. 15 g Moiz Ryant A, NP   amoxicillin (AMOXIL) 500 MG capsule Take 2 capsules (1,000 mg total) by mouth 3 (three) times daily for 5 days. 30 capsule Uriyah Massimo A, NP     PDMP not reviewed this encounter.   Janace Aris, NP 11/02/20 [phone]

## 2020-11-02 NOTE — ED Triage Notes (Signed)
Patient presents to Urgent Care with complaints of sore throat, headache, cough, congestion. Fatigue started sat 01/15.  rapid COVID test negative 01/18 .  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 Treating symptoms with Tylenol, Dayquil, Excedrin.

## 2020-11-12 ENCOUNTER — Other Ambulatory Visit: Payer: Self-pay | Admitting: Internal Medicine

## 2020-11-26 ENCOUNTER — Other Ambulatory Visit: Payer: Self-pay | Admitting: Internal Medicine

## 2020-12-03 ENCOUNTER — Other Ambulatory Visit: Payer: Self-pay | Admitting: Internal Medicine

## 2021-02-18 ENCOUNTER — Other Ambulatory Visit: Payer: Self-pay

## 2021-02-18 MED FILL — Escitalopram Oxalate Tab 20 MG (Base Equiv): ORAL | 90 days supply | Qty: 90 | Fill #0 | Status: AC

## 2021-03-12 ENCOUNTER — Other Ambulatory Visit: Payer: Self-pay

## 2021-03-13 ENCOUNTER — Other Ambulatory Visit: Payer: Self-pay

## 2021-03-13 MED ORDER — VYVANSE 70 MG PO CAPS
70.0000 mg | ORAL_CAPSULE | Freq: Every morning | ORAL | 0 refills | Status: DC
  Filled 2021-03-13: qty 90, 90d supply, fill #0

## 2021-04-16 ENCOUNTER — Other Ambulatory Visit: Payer: Self-pay

## 2021-04-16 MED ORDER — METHYLPHENIDATE HCL ER (OSM) 54 MG PO TBCR
EXTENDED_RELEASE_TABLET | ORAL | 0 refills | Status: AC
  Filled 2021-04-16: qty 30, 30d supply, fill #0

## 2021-04-16 MED ORDER — METHYLPHENIDATE HCL ER (OSM) 54 MG PO TBCR: EXTENDED_RELEASE_TABLET | ORAL | 0 refills | Status: AC

## 2021-04-16 MED ORDER — TRAZODONE HCL 100 MG PO TABS
ORAL_TABLET | ORAL | 3 refills | Status: DC
  Filled 2021-04-16: qty 90, 90d supply, fill #0
  Filled 2021-07-12: qty 90, 90d supply, fill #1
  Filled 2021-10-04: qty 90, 90d supply, fill #2
  Filled 2022-01-16: qty 90, 90d supply, fill #3

## 2021-05-22 ENCOUNTER — Other Ambulatory Visit: Payer: Self-pay

## 2021-05-22 MED FILL — Escitalopram Oxalate Tab 20 MG (Base Equiv): ORAL | 90 days supply | Qty: 90 | Fill #1 | Status: AC

## 2021-05-23 ENCOUNTER — Other Ambulatory Visit: Payer: Self-pay

## 2021-05-23 MED ORDER — VYVANSE 70 MG PO CAPS
70.0000 mg | ORAL_CAPSULE | Freq: Every morning | ORAL | 0 refills | Status: AC
  Filled 2021-05-23: qty 90, 90d supply, fill #0
  Filled 2021-07-12: qty 3, 3d supply, fill #0
  Filled 2021-07-12: qty 87, 87d supply, fill #0

## 2021-05-24 ENCOUNTER — Other Ambulatory Visit: Payer: Self-pay

## 2021-05-24 ENCOUNTER — Other Ambulatory Visit (HOSPITAL_COMMUNITY): Payer: Self-pay

## 2021-06-12 ENCOUNTER — Other Ambulatory Visit: Payer: Self-pay

## 2021-06-12 MED ORDER — VYVANSE 70 MG PO CAPS
ORAL_CAPSULE | ORAL | 0 refills | Status: AC
  Filled 2021-10-04: qty 30, 30d supply, fill #0

## 2021-06-12 MED ORDER — VYVANSE 70 MG PO CAPS: ORAL_CAPSULE | ORAL | 0 refills | Status: AC

## 2021-06-12 MED ORDER — VYVANSE 70 MG PO CAPS
ORAL_CAPSULE | ORAL | 0 refills | Status: AC
  Filled 2021-06-12: qty 30, 30d supply, fill #0

## 2021-07-03 ENCOUNTER — Other Ambulatory Visit: Payer: Self-pay

## 2021-07-03 MED ORDER — VYVANSE 70 MG PO CAPS: ORAL_CAPSULE | ORAL | 0 refills | Status: AC

## 2021-07-03 MED ORDER — AMPHETAMINE-DEXTROAMPHETAMINE 10 MG PO TABS
ORAL_TABLET | ORAL | 0 refills | Status: DC
  Filled 2021-07-03: qty 90, 90d supply, fill #0

## 2021-07-03 MED ORDER — LEVOTHYROXINE SODIUM 100 MCG PO TABS
ORAL_TABLET | ORAL | 1 refills | Status: DC
  Filled 2021-07-03: qty 90, 90d supply, fill #0
  Filled 2021-10-04: qty 90, 90d supply, fill #1

## 2021-07-12 ENCOUNTER — Other Ambulatory Visit: Payer: Self-pay

## 2021-07-15 ENCOUNTER — Other Ambulatory Visit: Payer: Self-pay

## 2021-09-04 ENCOUNTER — Other Ambulatory Visit: Payer: Self-pay

## 2021-09-06 ENCOUNTER — Other Ambulatory Visit: Payer: Self-pay

## 2021-09-08 MED ORDER — ESCITALOPRAM OXALATE 20 MG PO TABS
ORAL_TABLET | Freq: Every day | ORAL | 1 refills | Status: DC
  Filled 2021-09-08: qty 90, 90d supply, fill #0
  Filled 2021-12-30: qty 90, 90d supply, fill #1

## 2021-09-09 ENCOUNTER — Other Ambulatory Visit: Payer: Self-pay

## 2021-09-10 ENCOUNTER — Encounter: Payer: Self-pay | Admitting: Emergency Medicine

## 2021-09-10 ENCOUNTER — Other Ambulatory Visit: Payer: Self-pay

## 2021-09-10 ENCOUNTER — Ambulatory Visit
Admission: EM | Admit: 2021-09-10 | Discharge: 2021-09-10 | Disposition: A | Discharge status: Home / Self Care | Payer: No Typology Code available for payment source | Attending: Emergency Medicine | Admitting: Emergency Medicine

## 2021-09-10 DIAGNOSIS — B349 Viral infection, unspecified: Secondary | ICD-10-CM

## 2021-09-10 MED ORDER — ONDANSETRON 4 MG PO TBDP
4.0000 mg | ORAL_TABLET | Freq: Three times a day (TID) | ORAL | 0 refills | Status: AC | PRN
  Filled 2021-09-10: qty 20, 7d supply, fill #0

## 2021-09-10 MED ORDER — BENZONATATE 100 MG PO CAPS
100.0000 mg | ORAL_CAPSULE | Freq: Three times a day (TID) | ORAL | 0 refills | Status: AC | PRN
  Filled 2021-09-10: qty 21, 7d supply, fill #0

## 2021-09-10 NOTE — Discharge Instructions (Addendum)
Your COVID and Flu tests are pending.  You should self quarantine until the test results are back.    Take Tylenol or ibuprofen as needed for fever or discomfort.  Take the [facility] as needed for cough.  Rest and keep yourself hydrated.  Take the antinausea medication as directed.  Keep yourself hydrated with clear liquids, such as water, Gatorade, Pedialyte, Sprite, or ginger ale.    Follow up with your primary care provider if your symptoms are not improving.

## 2021-09-10 NOTE — ED Provider Notes (Signed)
[facility]    CSN: [id number] Arrival date & time: 09/10/21  1002      History   Chief Complaint Chief Complaint  Patient presents with   Fever   Chills   Headache    HPI Robin Stone is a 45 y.o. female.  Patient presents with 4 day history of fever, chills, body aches, headache, congestion, cough, nausea, diarrhea.  Treatment at home with Tylenol.  No rash, shortness of breath, vomiting, or other symptoms.  Her medical history includes hypertension, hypothyroidism, anemia.  The history is provided by the patient and medical records.   Past Medical History:  Diagnosis Date   Anemia    History of total vaginal hysterectomy (TVH) 04/02/2015   TVH, bilateral salpingectomy   Hypothyroidism     Patient Active Problem List   Diagnosis Date Noted   Weight gain following gastric bypass surgery 10/02/2016   Absolute anemia 10/02/2015   Anxiety 10/02/2015   Benign hypertension 10/02/2015   Hypothyroidism 10/02/2015   Major depression in remission (HCC) 10/02/2015   History of total vaginal hysterectomy (TVH) 04/02/2015    Past Surgical History:  Procedure Laterality Date   AUGMENTATION MAMMAPLASTY Bilateral    implant and areola reduction   CESAREAN SECTION     CHOLECYSTECTOMY     DILATION AND CURETTAGE OF UTERUS     GASTRIC BYPASS  2010   TUBAL LIGATION     VAGINAL HYSTERECTOMY  03/2014   with bilateal salpingectomy - leiomyoma    OB History     Gravida  2   Para  2   Term  2   Preterm      AB      Living  2      SAB      IAB      Ectopic      Multiple      Live Births  2            Home Medications    Prior to Admission medications   Medication Sig Start Date End Date Taking? Authorizing Provider  benzonatate (TESSALON) 100 MG capsule Take 1 capsule (100 mg total) by mouth 3 (three) times daily as needed for cough. 09/10/21  Yes Mickie Bail, NP  cetirizine (ZYRTEC) 10 MG tablet Take 10 mg by mouth daily.   Yes  [provider]  escitalopram (LEXAPRO) 20 MG tablet TAKE 1 TABLET BY MOUTH ONCE DAILY 09/08/21 09/08/22 Yes   levocetirizine (XYZAL) 5 MG tablet TAKE 1 TABLET BY MOUTH EVERY EVENING 12/03/20 12/03/21 Yes Sparks, Duane Lope, MD  levothyroxine (SYNTHROID) 100 MCG tablet Take by mouth. 07/03/20  Yes [provider]  lisdexamfetamine (VYVANSE) 70 MG capsule TAKE 1 CAPSULE BY MOUTH EVERY MORNING 05/22/21  Yes   Multiple Vitamin (MULTI-VITAMIN) tablet Take 1 tablet by mouth daily.   Yes [provider]  ondansetron (ZOFRAN-ODT) 4 MG disintegrating tablet Take 1 tablet (4 mg total) by mouth every 8 (eight) hours as needed for nausea or vomiting. 09/10/21  Yes Mickie Bail, NP  propranolol (INDERAL) 20 MG tablet TAKE 1 TABLET BY MOUTH TWO TIMES DAILY 12/03/20 12/03/21 Yes Marguarite Arbour, MD  traZODone (DESYREL) 100 MG tablet Take 100 mg by mouth at bedtime.   Yes [provider]  amitriptyline (ELAVIL) 25 MG tablet Take 25 mg by mouth at bedtime.    [provider]  amoxicillin (AMOXIL) 500 MG capsule TAKE 2 CAPSULES (1,000 MG TOTAL) BY MOUTH 3 (  THREE) TIMES DAILY FOR 5 DAYS. 11/02/20 11/02/21  Dahlia Byes A, NP  amphetamine-dextroamphetamine (ADDERALL) 10 MG tablet Take 1 tablet (10 mg total) by mouth every evening for 90 days 07/03/21     azithromycin (ZITHROMAX Z-PAK) 250 MG tablet 2 pills today then 1 pill a day for 4 days 06/24/17   Sherrie Mustache Roselyn Bering, PA-C  ciprofloxacin (CIPRO) 500 MG tablet TAKE 1 TABLET BY MOUTH TWICE DAILY FOR 7 DAYS 11/26/20 11/26/21  Marguarite Arbour, MD  escitalopram (LEXAPRO) 20 MG tablet TAKE 1 TABLET BY MOUTH ONCE DAILY 07/03/20 07/03/21  Marguarite Arbour, MD  ferrous sulfate 325 (65 FE) MG tablet Take 325 mg by mouth daily with breakfast. Reported on 10/02/2015    [provider]  hydrochlorothiazide (HYDRODIURIL) 25 MG tablet Take by mouth. 08/16/15   [provider]  ibuprofen (ADVIL,MOTRIN) 200 MG tablet Take 200 mg by  mouth every 6 (six) hours as needed.    [provider]  levothyroxine (SYNTHROID) 100 MCG tablet TAKE 1 TABLET BY MOUTH ONCE DAILY TAKE ON AN EMPTY STOMACH WITH A GLASS OF WATER AT LEAST 30-60 MINUTES BEFORE BREAKFAST. 12/03/20 12/03/21  Marguarite Arbour, MD  levothyroxine (SYNTHROID) 100 MCG tablet Take 1 tablet (100 mcg total) by mouth once daily Take on an empty stomach with a glass of water at least 30-60 minutes before breakfast. 07/03/21     levothyroxine (SYNTHROID, LEVOTHROID) 125 MCG tablet Take by mouth. 04/20/17 10/17/17  [provider]  lisdexamfetamine (VYVANSE) 70 MG capsule TAKE 1 CAPSULE BY MOUTH EVERY MORNING 11/12/20 05/11/21  Marguarite Arbour, MD  lisdexamfetamine (VYVANSE) 70 MG capsule TAKE 1 CAPSULE (70 MG TOTAL) BY MOUTH EVERY MORNING 09/27/20 03/26/21  Marguarite Arbour, MD  lisdexamfetamine (VYVANSE) 70 MG capsule TAKE 1 CAPSULE (70 MG TOTAL) BY MOUTH EVERY MORNING 08/10/20 02/06/21  Marguarite Arbour, MD  lisdexamfetamine (VYVANSE) 70 MG capsule Take 1 capsule (70 mg total) by mouth every morning for 30 days 08/10/21     lisdexamfetamine (VYVANSE) 70 MG capsule Take 1 capsule (70 mg total) by mouth every morning for 30 days 07/11/21     lisdexamfetamine (VYVANSE) 70 MG capsule Take 1 capsule (70 mg total) by mouth every morning for 30 days 06/12/21     lisdexamfetamine (VYVANSE) 70 MG capsule Take 1 capsule (70 mg total) by mouth every morning for 30 days 08/31/21     lisdexamfetamine (VYVANSE) 70 MG capsule Take 1 capsule (70 mg total) by mouth every morning for 30 days 07/03/21     lisdexamfetamine (VYVANSE) 70 MG capsule Take 1 capsule (70 mg total) by mouth every morning for 30 days 08/01/21     MAGNESIUM PO Take by mouth.    [provider]  methylphenidate (CONCERTA) 54 MG PO CR tablet Take by mouth. 06/14/21     methylphenidate (CONCERTA) 54 MG PO CR tablet Take by mouth. 05/15/21     methylphenidate (CONCERTA) 54 MG PO CR tablet Take by mouth. 04/16/21      methylPREDNISolone (MEDROL DOSEPAK) 4 MG TBPK tablet Take 6 pills on day one then decrease by 1 pill each day 06/24/17   Faythe Ghee, PA-C  mupirocin cream (BACTROBAN) 2 % APPLY 1 TOPICALLY 2 (TWO) TIMES DAILY. 11/02/20 11/02/21  Dahlia Byes A, NP  naproxen (NAPROSYN) 500 MG tablet Take 1 tablet (500 mg total) by mouth 2 (two) times daily with a meal. Patient not taking: Reported on 06/24/2017 05/12/16   Joni Reining, PA-C  QUEtiapine (SEROQUEL) 50 MG tablet TAKE 1 TABLET BY MOUTH NIGHTLY 08/31/20 08/31/21  Marguarite Arbour, MD  traZODone (DESYREL) 100 MG tablet Take 1 tablet (100 mg total) by mouth nightly for 30 days 04/16/21     VYVANSE 70 MG capsule Take 70 mg by mouth at bedtime. 10/11/20   [provider]    Family History Family History  Problem Relation Age of Onset   Diabetes Father    Healthy Mother    Heart disease Neg Hx    Cancer Neg Hx    Breast cancer Neg Hx     Social History Social History   Tobacco Use   Smoking status: Never   Smokeless tobacco: Never  Vaping Use   Vaping Use: Never used  Substance Use Topics   Alcohol use: No   Drug use: No     Allergies   Sulfa antibiotics   Review of Systems Review of Systems  Constitutional:  Positive for chills and fever.  HENT:  Positive for congestion. Negative for ear pain and sore throat.   Respiratory:  Positive for cough. Negative for shortness of breath.   Cardiovascular:  Negative for chest pain and palpitations.  Gastrointestinal:  Positive for diarrhea and nausea. Negative for abdominal pain and vomiting.  Skin:  Negative for color change and rash.  Neurological:  Positive for headaches. Negative for syncope.  All other systems reviewed and are negative.   Physical Exam Triage Vital Signs ED Triage Vitals  Enc Vitals Group     BP 09/10/21 1129 128/84     Pulse Rate 09/10/21 1129 75     Resp 09/10/21 1129 18     Temp 09/10/21 1129 97.9 F (36.6 C)     Temp Source 09/10/21 1129 Oral      SpO2 09/10/21 1129 98 %     Weight --      Height --      Head Circumference --      Peak Flow --      Pain Score 09/10/21 1130 0     Pain Loc --      Pain Edu? --      Excl. in GC? --    No data found.  Updated Vital Signs BP 128/84 (BP Location: Left Arm)   Pulse 75   Temp 97.9 F (36.6 C) (Oral)   Resp 18   SpO2 98%   Visual Acuity Right Eye Distance:   Left Eye Distance:   Bilateral Distance:    Right Eye Near:   Left Eye Near:    Bilateral Near:     Physical Exam Vitals and nursing note reviewed.  Constitutional:      General: She is not in acute distress.    Appearance: She is well-developed.  HENT:     Head: Normocephalic and atraumatic.     Right Ear: Tympanic membrane normal.     Left Ear: Tympanic membrane normal.     Nose: Nose normal.     Mouth/Throat:     Mouth: Mucous membranes are moist.     Pharynx: Oropharynx is clear.  Cardiovascular:     Rate and Rhythm: Normal rate and regular rhythm.     Heart sounds: Normal heart sounds.  Pulmonary:     Effort: Pulmonary effort is normal. No respiratory distress.     Breath sounds: Normal breath sounds.  Abdominal:     General: Bowel sounds are normal.     Palpations: Abdomen is soft.  Tenderness: There is no abdominal tenderness. There is no guarding or rebound.  Musculoskeletal:     Cervical back: Neck supple.  Skin:    General: Skin is warm and dry.  Neurological:     Mental Status: She is alert.  Psychiatric:        Mood and Affect: Mood normal.        Behavior: Behavior normal.     UC Treatments / Results  Labs (all labs ordered are listed, but only abnormal results are displayed) Labs Reviewed  COVID-19, FLU A+B NAA    EKG   Radiology No results found.  Procedures Procedures (including critical care time)  Medications Ordered in UC Medications - No data to display  Initial Impression / Assessment and Plan / UC Course  I have reviewed the triage vital signs and the  nursing notes.  Pertinent labs & imaging results that were available during my care of the patient were reviewed by me and considered in my medical decision making (see chart for details).   Viral illness.  COVID and Flu pending.  Instructed patient to self quarantine per CDC guidelines.  Treating nausea and vomiting with Zofran and clear liquid diet.  Treating cough with Tessalon Perles.  Discussed symptomatic treatment including Tylenol or ibuprofen.  Instructed patient to follow up with PCP if symptoms are not improving.  Patient agrees to plan of care.    Final Clinical Impressions(s) / UC Diagnoses   Final diagnoses:  Viral illness     Discharge Instructions      Your COVID and Flu tests are pending.  You should self quarantine until the test results are back.    Take Tylenol or ibuprofen as needed for fever or discomfort.  Take the [facility] as needed for cough.  Rest and keep yourself hydrated.  Take the antinausea medication as directed.  Keep yourself hydrated with clear liquids, such as water, Gatorade, Pedialyte, Sprite, or ginger ale.    Follow up with your primary care provider if your symptoms are not improving.           ED Prescriptions     Medication Sig Dispense Auth. Provider   ondansetron (ZOFRAN-ODT) 4 MG disintegrating tablet Take 1 tablet (4 mg total) by mouth every 8 (eight) hours as needed for nausea or vomiting. 20 tablet Mickie Bail, NP   benzonatate (TESSALON) 100 MG capsule Take 1 capsule (100 mg total) by mouth 3 (three) times daily as needed for cough. 21 capsule Mickie Bail, NP      PDMP not reviewed this encounter.   Mickie Bail, NP 09/10/21 [phone]

## 2021-09-10 NOTE — ED Triage Notes (Signed)
Pt c/o fever, chills, bodyaches, HA, and cough x 4 days.

## 2021-09-11 LAB — COVID-19, FLU A+B NAA
Influenza A, NAA: NOT DETECTED
Influenza B, NAA: NOT DETECTED
SARS-CoV-2, NAA: NOT DETECTED

## 2021-10-04 ENCOUNTER — Other Ambulatory Visit: Payer: Self-pay

## 2021-10-04 MED ORDER — AMPHETAMINE-DEXTROAMPHETAMINE 10 MG PO TABS
ORAL_TABLET | ORAL | 0 refills | Status: DC
  Filled 2021-10-04: qty 90, 90d supply, fill #0

## 2021-10-08 ENCOUNTER — Other Ambulatory Visit: Payer: Self-pay

## 2021-10-15 ENCOUNTER — Other Ambulatory Visit: Payer: Self-pay | Admitting: Internal Medicine

## 2021-10-15 DIAGNOSIS — R921 Mammographic calcification found on diagnostic imaging of breast: Secondary | ICD-10-CM

## 2021-10-17 ENCOUNTER — Other Ambulatory Visit: Payer: Self-pay

## 2021-10-17 MED ORDER — AMPHETAMINE-DEXTROAMPHETAMINE 30 MG PO TABS
ORAL_TABLET | ORAL | 0 refills | Status: DC
  Filled 2021-10-17: qty 60, 30d supply, fill #0

## 2021-10-18 ENCOUNTER — Other Ambulatory Visit: Payer: Self-pay

## 2021-10-23 ENCOUNTER — Other Ambulatory Visit: Payer: Self-pay

## 2021-10-23 ENCOUNTER — Ambulatory Visit
Admission: RE | Admit: 2021-10-23 | Discharge: 2021-10-23 | Disposition: A | Discharge status: Home / Self Care | Payer: No Typology Code available for payment source | Source: Ambulatory Visit | Attending: Internal Medicine | Admitting: Internal Medicine

## 2021-10-23 DIAGNOSIS — R921 Mammographic calcification found on diagnostic imaging of breast: Secondary | ICD-10-CM

## 2021-11-29 ENCOUNTER — Other Ambulatory Visit: Payer: Self-pay

## 2021-11-29 MED ORDER — AMPHETAMINE-DEXTROAMPHETAMINE 30 MG PO TABS
ORAL_TABLET | ORAL | 0 refills | Status: DC
  Filled 2021-11-29: qty 60, 30d supply, fill #0

## 2021-12-30 ENCOUNTER — Other Ambulatory Visit: Payer: Self-pay

## 2022-01-16 ENCOUNTER — Other Ambulatory Visit: Payer: Self-pay

## 2022-01-19 MED ORDER — PROPRANOLOL HCL 20 MG PO TABS
ORAL_TABLET | Freq: Two times a day (BID) | ORAL | 1 refills | Status: DC
  Filled 2022-01-19: qty 180, 90d supply, fill #0
  Filled 2022-05-07: qty 180, 90d supply, fill #1

## 2022-01-19 MED ORDER — LEVOTHYROXINE SODIUM 100 MCG PO TABS
ORAL_TABLET | ORAL | 1 refills | Status: DC
  Filled 2022-01-19: qty 90, 90d supply, fill #0
  Filled 2022-05-07: qty 90, 90d supply, fill #1

## 2022-01-20 ENCOUNTER — Other Ambulatory Visit: Payer: Self-pay

## 2022-01-20 MED ORDER — AMPHETAMINE-DEXTROAMPHETAMINE 30 MG PO TABS
ORAL_TABLET | ORAL | 0 refills | Status: AC
  Filled 2022-01-20: qty 60, 30d supply, fill #0

## 2022-02-06 ENCOUNTER — Other Ambulatory Visit: Payer: Self-pay

## 2022-02-06 MED ORDER — ESCITALOPRAM OXALATE 20 MG PO TABS
ORAL_TABLET | ORAL | 1 refills | Status: AC
  Filled 2022-02-06 – 2022-03-28 (×2): qty 90, 90d supply, fill #0

## 2022-02-06 MED ORDER — OZEMPIC (0.25 OR 0.5 MG/DOSE) 2 MG/3ML ~~LOC~~ SOPN
PEN_INJECTOR | SUBCUTANEOUS | 2 refills | Status: AC
  Filled 2022-02-06: qty 3, 28d supply, fill #0

## 2022-02-06 MED ORDER — AMPHETAMINE-DEXTROAMPHETAMINE 30 MG PO TABS
ORAL_TABLET | ORAL | 0 refills | Status: DC
  Filled 2022-03-31: qty 60, 30d supply, fill #0

## 2022-02-07 ENCOUNTER — Other Ambulatory Visit: Payer: Self-pay

## 2022-02-07 MED ORDER — WEGOVY 0.5 MG/0.5ML ~~LOC~~ SOAJ
SUBCUTANEOUS | 2 refills | Status: AC
  Filled 2022-02-07 – 2022-02-21 (×3): qty 2, 28d supply, fill #0
  Filled 2022-03-20 – 2022-05-07 (×2): qty 2, 28d supply, fill #1
  Filled 2022-06-03: qty 2, 28d supply, fill #2

## 2022-02-17 ENCOUNTER — Other Ambulatory Visit: Payer: Self-pay

## 2022-02-18 ENCOUNTER — Other Ambulatory Visit: Payer: Self-pay

## 2022-02-21 ENCOUNTER — Other Ambulatory Visit: Payer: Self-pay

## 2022-03-05 ENCOUNTER — Other Ambulatory Visit: Payer: Self-pay

## 2022-03-05 MED ORDER — AMPHETAMINE-DEXTROAMPHETAMINE 10 MG PO TABS
ORAL_TABLET | ORAL | 0 refills | Status: AC
  Filled 2022-03-05: qty 90, 90d supply, fill #0

## 2022-03-06 ENCOUNTER — Other Ambulatory Visit: Payer: Self-pay

## 2022-03-20 ENCOUNTER — Other Ambulatory Visit: Payer: Self-pay

## 2022-03-21 ENCOUNTER — Other Ambulatory Visit: Payer: Self-pay

## 2022-03-21 MED ORDER — SAXENDA 18 MG/3ML ~~LOC~~ SOPN
PEN_INJECTOR | SUBCUTANEOUS | 2 refills | Status: AC
  Filled 2022-03-21 – 2022-03-28 (×4): qty 15, 30d supply, fill #0
  Filled 2022-12-06: qty 15, 30d supply, fill #1

## 2022-03-24 ENCOUNTER — Other Ambulatory Visit: Payer: Self-pay

## 2022-03-26 ENCOUNTER — Other Ambulatory Visit: Payer: Self-pay

## 2022-03-28 ENCOUNTER — Other Ambulatory Visit: Payer: Self-pay

## 2022-03-28 MED ORDER — UNIFINE PENTIPS 31G X 5 MM MISC
0 refills | Status: AC
  Filled 2022-03-28: qty 100, 90d supply, fill #0

## 2022-03-31 ENCOUNTER — Other Ambulatory Visit: Payer: Self-pay

## 2022-04-03 ENCOUNTER — Other Ambulatory Visit: Payer: Self-pay

## 2022-04-03 MED ORDER — AMPHETAMINE-DEXTROAMPHETAMINE 30 MG PO TABS
ORAL_TABLET | ORAL | 0 refills | Status: DC
  Filled 2022-05-07: qty 60, 30d supply, fill #0

## 2022-04-11 ENCOUNTER — Other Ambulatory Visit: Payer: Self-pay

## 2022-04-16 ENCOUNTER — Other Ambulatory Visit: Payer: Self-pay

## 2022-05-07 ENCOUNTER — Other Ambulatory Visit: Payer: Self-pay

## 2022-05-07 MED ORDER — TRAZODONE HCL 100 MG PO TABS
ORAL_TABLET | ORAL | 3 refills | Status: DC
  Filled 2022-05-07: qty 90, 90d supply, fill #0
  Filled 2022-07-28: qty 90, 90d supply, fill #1
  Filled 2022-11-14: qty 90, 90d supply, fill #2
  Filled 2023-03-17: qty 90, 90d supply, fill #3

## 2022-05-08 ENCOUNTER — Other Ambulatory Visit: Payer: Self-pay

## 2022-05-09 ENCOUNTER — Other Ambulatory Visit: Payer: Self-pay

## 2022-06-03 ENCOUNTER — Other Ambulatory Visit: Payer: Self-pay

## 2022-06-05 ENCOUNTER — Other Ambulatory Visit: Payer: Self-pay

## 2022-06-05 MED ORDER — OZEMPIC (1 MG/DOSE) 4 MG/3ML ~~LOC~~ SOPN
PEN_INJECTOR | SUBCUTANEOUS | 3 refills | Status: AC
  Filled 2022-06-05: qty 3, 28d supply, fill #0

## 2022-06-05 MED ORDER — WEGOVY 1 MG/0.5ML ~~LOC~~ SOAJ
SUBCUTANEOUS | 3 refills | Status: AC
  Filled 2022-06-05: qty 2, 28d supply, fill #0
  Filled 2022-07-03: qty 2, 28d supply, fill #1
  Filled 2022-07-28: qty 2, 28d supply, fill #2
  Filled 2022-08-29: qty 2, 28d supply, fill #3

## 2022-06-17 ENCOUNTER — Other Ambulatory Visit: Payer: Self-pay

## 2022-06-17 MED ORDER — AMPHETAMINE-DEXTROAMPHETAMINE 30 MG PO TABS
ORAL_TABLET | ORAL | 0 refills | Status: DC
  Filled 2022-06-17: qty 60, 30d supply, fill #0

## 2022-06-25 ENCOUNTER — Other Ambulatory Visit: Payer: Self-pay

## 2022-06-25 MED ORDER — DESVENLAFAXINE SUCCINATE ER 50 MG PO TB24
50.0000 mg | ORAL_TABLET | Freq: Every day | ORAL | 3 refills | Status: AC
  Filled 2022-06-25: qty 90, 90d supply, fill #0

## 2022-07-03 ENCOUNTER — Other Ambulatory Visit: Payer: Self-pay

## 2022-07-28 ENCOUNTER — Other Ambulatory Visit: Payer: Self-pay

## 2022-07-28 MED ORDER — LEVOTHYROXINE SODIUM 100 MCG PO TABS
ORAL_TABLET | ORAL | 1 refills | Status: AC
  Filled 2022-07-28: qty 90, 90d supply, fill #0
  Filled 2022-08-29 – 2023-07-17 (×2): qty 90, 90d supply, fill #1

## 2022-07-28 MED ORDER — AMPHETAMINE-DEXTROAMPHETAMINE 10 MG PO TABS
ORAL_TABLET | ORAL | 0 refills | Status: DC
  Filled 2022-07-28: qty 90, 90d supply, fill #0

## 2022-07-28 MED ORDER — AMPHETAMINE-DEXTROAMPHETAMINE 30 MG PO TABS
ORAL_TABLET | ORAL | 0 refills | Status: AC
  Filled 2022-07-28: qty 60, 30d supply, fill #0

## 2022-07-28 MED ORDER — AMPHETAMINE-DEXTROAMPHETAMINE 30 MG PO TABS
ORAL_TABLET | ORAL | 0 refills | Status: DC
  Filled 2022-07-28 – 2022-08-29 (×2): qty 60, 30d supply, fill #0

## 2022-08-05 ENCOUNTER — Other Ambulatory Visit: Payer: Self-pay

## 2022-08-29 ENCOUNTER — Other Ambulatory Visit: Payer: Self-pay

## 2022-08-29 MED ORDER — PROPRANOLOL HCL 20 MG PO TABS
ORAL_TABLET | Freq: Two times a day (BID) | ORAL | 1 refills | Status: AC
  Filled 2022-08-29: qty 180, 90d supply, fill #0

## 2022-08-29 MED ORDER — AMPHETAMINE-DEXTROAMPHETAMINE 10 MG PO TABS
ORAL_TABLET | ORAL | 0 refills | Status: DC
  Filled 2022-11-14: qty 90, 90d supply, fill #0

## 2022-08-29 MED ORDER — LEVOTHYROXINE SODIUM 100 MCG PO TABS
100.0000 ug | ORAL_TABLET | Freq: Every day | ORAL | 1 refills | Status: AC
  Filled 2022-08-29 – 2022-11-14 (×2): qty 90, 90d supply, fill #0
  Filled 2023-03-17: qty 90, 90d supply, fill #1

## 2022-09-29 ENCOUNTER — Other Ambulatory Visit: Payer: Self-pay

## 2022-09-29 ENCOUNTER — Other Ambulatory Visit: Payer: Self-pay | Admitting: Internal Medicine

## 2022-09-29 DIAGNOSIS — Z1231 Encounter for screening mammogram for malignant neoplasm of breast: Secondary | ICD-10-CM

## 2022-09-29 MED ORDER — DESVENLAFAXINE SUCCINATE ER 50 MG PO TB24
50.0000 mg | ORAL_TABLET | Freq: Every day | ORAL | 3 refills | Status: DC
  Filled 2022-09-29: qty 90, 90d supply, fill #0
  Filled 2022-12-29: qty 90, 90d supply, fill #1
  Filled 2023-03-17: qty 90, 90d supply, fill #2
  Filled 2023-07-01: qty 90, 90d supply, fill #3

## 2022-09-29 MED ORDER — TOPIRAMATE 50 MG PO TABS
50.0000 mg | ORAL_TABLET | Freq: Every day | ORAL | 1 refills | Status: DC
  Filled 2022-09-29: qty 90, 90d supply, fill #0

## 2022-09-29 MED ORDER — AMPHETAMINE-DEXTROAMPHETAMINE 30 MG PO TABS
30.0000 mg | ORAL_TABLET | Freq: Two times a day (BID) | ORAL | 0 refills | Status: DC
  Filled 2022-09-29: qty 60, 30d supply, fill #0

## 2022-09-29 MED ORDER — WEGOVY 1.7 MG/0.75ML ~~LOC~~ SOAJ
1.7000 mg | SUBCUTANEOUS | 3 refills | Status: AC
Start: 2022-09-29 — End: ?
  Filled 2022-09-29 – 2022-12-17 (×7): qty 3, 28d supply, fill #0

## 2022-09-29 MED ORDER — AMPHETAMINE-DEXTROAMPHETAMINE 10 MG PO TABS
10.0000 mg | ORAL_TABLET | Freq: Every evening | ORAL | 0 refills | Status: AC
  Filled 2022-09-29 – 2023-03-17 (×3): qty 90, 90d supply, fill #0

## 2022-10-03 ENCOUNTER — Other Ambulatory Visit: Payer: Self-pay

## 2022-10-15 ENCOUNTER — Other Ambulatory Visit: Payer: Self-pay

## 2022-10-31 ENCOUNTER — Other Ambulatory Visit: Payer: Self-pay

## 2022-10-31 MED ORDER — ZEPBOUND 5 MG/0.5ML ~~LOC~~ SOAJ
5.0000 mg | SUBCUTANEOUS | 1 refills | Status: AC
  Filled 2022-10-31 – 2022-12-19 (×3): qty 2, 28d supply, fill #0
  Filled 2023-01-16 – 2023-01-26 (×3): qty 2, 28d supply, fill #1
  Filled 2023-02-25: qty 2, 28d supply, fill #2

## 2022-11-03 ENCOUNTER — Other Ambulatory Visit: Payer: Self-pay

## 2022-11-13 ENCOUNTER — Other Ambulatory Visit: Payer: Self-pay

## 2022-11-14 ENCOUNTER — Other Ambulatory Visit: Payer: Self-pay

## 2022-11-16 ENCOUNTER — Other Ambulatory Visit: Payer: Self-pay

## 2022-11-16 MED ORDER — AMPHETAMINE-DEXTROAMPHETAMINE 30 MG PO TABS
30.0000 mg | ORAL_TABLET | Freq: Two times a day (BID) | ORAL | 0 refills | Status: DC
  Filled 2022-11-16: qty 60, 30d supply, fill #0

## 2022-11-17 ENCOUNTER — Other Ambulatory Visit: Payer: Self-pay

## 2022-11-19 ENCOUNTER — Other Ambulatory Visit: Payer: Self-pay

## 2022-11-19 MED ORDER — WEGOVY 0.5 MG/0.5ML ~~LOC~~ SOAJ
0.5000 mg | SUBCUTANEOUS | 3 refills | Status: DC
  Filled 2022-11-19: qty 2, 28d supply, fill #0

## 2022-11-20 ENCOUNTER — Other Ambulatory Visit: Payer: Self-pay

## 2022-12-02 ENCOUNTER — Other Ambulatory Visit: Payer: Self-pay

## 2022-12-07 ENCOUNTER — Other Ambulatory Visit: Payer: Self-pay

## 2022-12-08 ENCOUNTER — Other Ambulatory Visit: Payer: Self-pay

## 2022-12-16 ENCOUNTER — Other Ambulatory Visit: Payer: Self-pay

## 2022-12-17 ENCOUNTER — Other Ambulatory Visit: Payer: Self-pay

## 2022-12-19 ENCOUNTER — Other Ambulatory Visit: Payer: Self-pay

## 2022-12-23 ENCOUNTER — Other Ambulatory Visit: Payer: Self-pay

## 2022-12-29 ENCOUNTER — Other Ambulatory Visit: Payer: Self-pay

## 2022-12-30 ENCOUNTER — Other Ambulatory Visit: Payer: Self-pay

## 2022-12-30 MED ORDER — AMPHETAMINE-DEXTROAMPHETAMINE 30 MG PO TABS
1.0000 | ORAL_TABLET | Freq: Two times a day (BID) | ORAL | 0 refills | Status: DC
  Filled 2022-12-30: qty 60, 30d supply, fill #0

## 2023-01-07 ENCOUNTER — Other Ambulatory Visit: Payer: Self-pay

## 2023-01-07 MED ORDER — ZEPBOUND 10 MG/0.5ML ~~LOC~~ SOAJ
10.0000 mg | SUBCUTANEOUS | 3 refills | Status: DC
  Filled 2023-01-07: qty 2, 28d supply, fill #0

## 2023-01-16 ENCOUNTER — Other Ambulatory Visit: Payer: Self-pay

## 2023-01-26 ENCOUNTER — Other Ambulatory Visit: Payer: Self-pay

## 2023-01-27 ENCOUNTER — Other Ambulatory Visit: Payer: Self-pay

## 2023-01-27 ENCOUNTER — Ambulatory Visit
Admission: RE | Admit: 2023-01-27 | Discharge: 2023-01-27 | Disposition: A | Discharge status: Home / Self Care | Payer: 59 | Source: Ambulatory Visit | Attending: Internal Medicine | Admitting: Internal Medicine

## 2023-01-27 ENCOUNTER — Other Ambulatory Visit: Payer: Self-pay | Admitting: Internal Medicine

## 2023-01-27 ENCOUNTER — Encounter: Payer: Self-pay | Admitting: Pharmacist

## 2023-01-27 DIAGNOSIS — Z1231 Encounter for screening mammogram for malignant neoplasm of breast: Secondary | ICD-10-CM | POA: Diagnosis not present

## 2023-01-28 ENCOUNTER — Other Ambulatory Visit: Payer: Self-pay

## 2023-01-28 MED ORDER — AMPHETAMINE-DEXTROAMPHETAMINE 30 MG PO TABS
1.0000 | ORAL_TABLET | Freq: Two times a day (BID) | ORAL | 0 refills | Status: DC
  Filled 2023-01-28: qty 60, 30d supply, fill #0

## 2023-01-29 ENCOUNTER — Other Ambulatory Visit: Payer: Self-pay

## 2023-02-03 ENCOUNTER — Other Ambulatory Visit: Payer: Self-pay

## 2023-02-23 ENCOUNTER — Other Ambulatory Visit (HOSPITAL_COMMUNITY): Payer: Self-pay

## 2023-02-24 ENCOUNTER — Other Ambulatory Visit: Payer: Self-pay

## 2023-02-24 MED ORDER — ZEPBOUND 7.5 MG/0.5ML ~~LOC~~ SOAJ
7.5000 mg | SUBCUTANEOUS | 1 refills | Status: DC
  Filled 2023-02-24 – 2023-03-17 (×2): qty 2, 28d supply, fill #0

## 2023-02-25 ENCOUNTER — Other Ambulatory Visit: Payer: Self-pay

## 2023-03-17 ENCOUNTER — Other Ambulatory Visit: Payer: Self-pay

## 2023-03-18 ENCOUNTER — Other Ambulatory Visit: Payer: Self-pay

## 2023-03-18 MED ORDER — AMPHETAMINE-DEXTROAMPHETAMINE 10 MG PO TABS
10.0000 mg | ORAL_TABLET | Freq: Every evening | ORAL | 0 refills | Status: AC
  Filled 2023-03-18: qty 90, 90d supply, fill #0

## 2023-03-18 MED ORDER — AMPHETAMINE-DEXTROAMPHETAMINE 30 MG PO TABS: 1.0000 | ORAL_TABLET | Freq: Two times a day (BID) | ORAL | 0 refills | Status: AC

## 2023-03-18 MED ORDER — AMPHETAMINE-DEXTROAMPHETAMINE 30 MG PO TABS
1.0000 | ORAL_TABLET | Freq: Two times a day (BID) | ORAL | 0 refills | Status: DC
  Filled 2023-03-18: qty 60, 30d supply, fill #0

## 2023-04-10 ENCOUNTER — Other Ambulatory Visit: Payer: Self-pay

## 2023-04-10 DIAGNOSIS — F909 Attention-deficit hyperactivity disorder, unspecified type: Secondary | ICD-10-CM | POA: Diagnosis not present

## 2023-04-10 DIAGNOSIS — E039 Hypothyroidism, unspecified: Secondary | ICD-10-CM | POA: Diagnosis not present

## 2023-04-10 DIAGNOSIS — Z13220 Encounter for screening for lipoid disorders: Secondary | ICD-10-CM | POA: Diagnosis not present

## 2023-04-10 DIAGNOSIS — Z79899 Other long term (current) drug therapy: Secondary | ICD-10-CM | POA: Diagnosis not present

## 2023-04-10 DIAGNOSIS — I10 Essential (primary) hypertension: Secondary | ICD-10-CM | POA: Diagnosis not present

## 2023-04-10 DIAGNOSIS — F419 Anxiety disorder, unspecified: Secondary | ICD-10-CM | POA: Diagnosis not present

## 2023-04-10 MED ORDER — LEVOCETIRIZINE DIHYDROCHLORIDE 5 MG PO TABS
5.0000 mg | ORAL_TABLET | Freq: Every evening | ORAL | 1 refills | Status: DC
  Filled 2023-04-10: qty 90, 90d supply, fill #0
  Filled 2023-09-17: qty 90, 90d supply, fill #1

## 2023-04-10 MED ORDER — ZEPBOUND 10 MG/0.5ML ~~LOC~~ SOAJ
10.0000 mg | SUBCUTANEOUS | 3 refills | Status: DC
  Filled 2023-04-10: qty 2, 28d supply, fill #0
  Filled 2023-05-13: qty 2, 28d supply, fill #1
  Filled 2023-06-12 (×2): qty 2, 28d supply, fill #2
  Filled 2023-07-16: qty 2, 28d supply, fill #3

## 2023-04-10 MED ORDER — AMPHETAMINE-DEXTROAMPHETAMINE 30 MG PO TABS
1.0000 | ORAL_TABLET | Freq: Two times a day (BID) | ORAL | 0 refills | Status: DC
  Filled 2023-04-10 – 2023-05-13 (×2): qty 60, 30d supply, fill #0

## 2023-04-10 MED ORDER — AMPHETAMINE-DEXTROAMPHETAMINE 10 MG PO TABS
10.0000 mg | ORAL_TABLET | Freq: Every evening | ORAL | 0 refills | Status: AC
  Filled 2023-06-17: qty 90, 90d supply, fill #0

## 2023-04-22 ENCOUNTER — Other Ambulatory Visit (HOSPITAL_COMMUNITY): Payer: Self-pay

## 2023-05-13 ENCOUNTER — Other Ambulatory Visit: Payer: Self-pay

## 2023-05-14 ENCOUNTER — Other Ambulatory Visit: Payer: Self-pay

## 2023-05-15 ENCOUNTER — Other Ambulatory Visit: Payer: Self-pay

## 2023-05-19 ENCOUNTER — Other Ambulatory Visit: Payer: Self-pay

## 2023-06-12 ENCOUNTER — Other Ambulatory Visit: Payer: Self-pay

## 2023-06-17 ENCOUNTER — Other Ambulatory Visit: Payer: Self-pay

## 2023-06-17 MED ORDER — AMPHETAMINE-DEXTROAMPHETAMINE 30 MG PO TABS
1.0000 | ORAL_TABLET | Freq: Two times a day (BID) | ORAL | 0 refills | Status: DC
  Filled 2023-06-17: qty 60, 30d supply, fill #0

## 2023-07-06 ENCOUNTER — Other Ambulatory Visit (HOSPITAL_COMMUNITY): Payer: Self-pay

## 2023-07-06 ENCOUNTER — Other Ambulatory Visit: Payer: Self-pay

## 2023-07-06 MED ORDER — TRAZODONE HCL 100 MG PO TABS
100.0000 mg | ORAL_TABLET | Freq: Every evening | ORAL | 3 refills | Status: DC
  Filled 2023-07-06: qty 90, 90d supply, fill #0
  Filled 2023-09-17: qty 90, 90d supply, fill #1
  Filled 2024-01-18: qty 90, 90d supply, fill #2
  Filled 2024-04-14: qty 90, 90d supply, fill #3

## 2023-07-10 DIAGNOSIS — Z79899 Other long term (current) drug therapy: Secondary | ICD-10-CM | POA: Diagnosis not present

## 2023-07-10 DIAGNOSIS — I10 Essential (primary) hypertension: Secondary | ICD-10-CM | POA: Diagnosis not present

## 2023-07-10 DIAGNOSIS — Z13220 Encounter for screening for lipoid disorders: Secondary | ICD-10-CM | POA: Diagnosis not present

## 2023-07-16 ENCOUNTER — Other Ambulatory Visit: Payer: Self-pay

## 2023-07-17 ENCOUNTER — Other Ambulatory Visit: Payer: Self-pay

## 2023-07-17 DIAGNOSIS — E66811 Obesity, class 1: Secondary | ICD-10-CM | POA: Diagnosis not present

## 2023-07-17 DIAGNOSIS — E6609 Other obesity due to excess calories: Secondary | ICD-10-CM | POA: Diagnosis not present

## 2023-07-17 DIAGNOSIS — E039 Hypothyroidism, unspecified: Secondary | ICD-10-CM | POA: Diagnosis not present

## 2023-07-17 DIAGNOSIS — I10 Essential (primary) hypertension: Secondary | ICD-10-CM | POA: Diagnosis not present

## 2023-07-17 DIAGNOSIS — F909 Attention-deficit hyperactivity disorder, unspecified type: Secondary | ICD-10-CM | POA: Diagnosis not present

## 2023-07-17 MED ORDER — ZEPBOUND 12.5 MG/0.5ML ~~LOC~~ SOAJ
12.5000 mg | SUBCUTANEOUS | 1 refills | Status: DC
  Filled 2023-07-17: qty 2, 28d supply, fill #0
  Filled 2023-08-14: qty 2, 28d supply, fill #1
  Filled 2023-09-17: qty 2, 28d supply, fill #2
  Filled 2023-10-19: qty 2, 28d supply, fill #3

## 2023-07-17 MED ORDER — LEVOTHYROXINE SODIUM 100 MCG PO TABS
100.0000 ug | ORAL_TABLET | Freq: Every day | ORAL | 1 refills | Status: DC
  Filled 2023-07-17: qty 30, 30d supply, fill #0
  Filled 2023-11-19: qty 90, 90d supply, fill #0
  Filled 2024-02-17 – 2024-02-19 (×2): qty 90, 90d supply, fill #1

## 2023-07-17 MED ORDER — AMPHETAMINE-DEXTROAMPHETAMINE 10 MG PO TABS: ORAL_TABLET | ORAL | 0 refills | Status: AC

## 2023-07-17 MED ORDER — AMPHETAMINE-DEXTROAMPHETAMINE 30 MG PO TABS
1.0000 | ORAL_TABLET | Freq: Two times a day (BID) | ORAL | 0 refills | Status: DC
  Filled 2023-07-17: qty 60, 30d supply, fill #0

## 2023-07-17 MED ORDER — DESVENLAFAXINE SUCCINATE ER 50 MG PO TB24
50.0000 mg | ORAL_TABLET | Freq: Every day | ORAL | 3 refills | Status: DC
  Filled 2023-10-02: qty 90, 90d supply, fill #0
  Filled 2023-12-16: qty 90, 90d supply, fill #1
  Filled 2024-04-04: qty 90, 90d supply, fill #2
  Filled 2024-07-07: qty 90, 90d supply, fill #3

## 2023-08-14 ENCOUNTER — Other Ambulatory Visit: Payer: Self-pay

## 2023-08-14 MED ORDER — AMPHETAMINE-DEXTROAMPHETAMINE 30 MG PO TABS
1.0000 | ORAL_TABLET | Freq: Two times a day (BID) | ORAL | 0 refills | Status: DC
  Filled 2023-08-14: qty 60, 30d supply, fill #0

## 2023-09-17 ENCOUNTER — Other Ambulatory Visit: Payer: Self-pay

## 2023-09-17 MED ORDER — AMPHETAMINE-DEXTROAMPHETAMINE 30 MG PO TABS
1.0000 | ORAL_TABLET | Freq: Two times a day (BID) | ORAL | 0 refills | Status: DC
  Filled 2023-09-17: qty 60, 30d supply, fill #0

## 2023-10-02 ENCOUNTER — Other Ambulatory Visit: Payer: Self-pay

## 2023-10-19 ENCOUNTER — Other Ambulatory Visit: Payer: Self-pay

## 2023-10-19 DIAGNOSIS — E66811 Obesity, class 1: Secondary | ICD-10-CM | POA: Diagnosis not present

## 2023-10-19 DIAGNOSIS — E039 Hypothyroidism, unspecified: Secondary | ICD-10-CM | POA: Diagnosis not present

## 2023-10-19 DIAGNOSIS — F909 Attention-deficit hyperactivity disorder, unspecified type: Secondary | ICD-10-CM | POA: Diagnosis not present

## 2023-10-19 DIAGNOSIS — R739 Hyperglycemia, unspecified: Secondary | ICD-10-CM | POA: Diagnosis not present

## 2023-10-19 DIAGNOSIS — F325 Major depressive disorder, single episode, in full remission: Secondary | ICD-10-CM | POA: Diagnosis not present

## 2023-10-19 DIAGNOSIS — Z79899 Other long term (current) drug therapy: Secondary | ICD-10-CM | POA: Diagnosis not present

## 2023-10-19 DIAGNOSIS — I10 Essential (primary) hypertension: Secondary | ICD-10-CM | POA: Diagnosis not present

## 2023-10-19 MED ORDER — AMPHETAMINE-DEXTROAMPHETAMINE 30 MG PO TABS
1.0000 | ORAL_TABLET | Freq: Two times a day (BID) | ORAL | 0 refills | Status: DC
  Filled 2023-10-19: qty 60, 30d supply, fill #0

## 2023-10-19 MED ORDER — AMPHETAMINE-DEXTROAMPHETAMINE 10 MG PO TABS
10.0000 mg | ORAL_TABLET | Freq: Every evening | ORAL | 0 refills | Status: DC
  Filled 2023-10-19: qty 90, 90d supply, fill #0

## 2023-10-19 MED ORDER — ZEPBOUND 15 MG/0.5ML ~~LOC~~ SOAJ
15.0000 mg | SUBCUTANEOUS | 1 refills | Status: DC
  Filled 2023-10-19: qty 2, 28d supply, fill #0
  Filled 2023-11-19: qty 2, 28d supply, fill #1
  Filled 2023-12-16: qty 2, 28d supply, fill #2
  Filled 2024-01-18: qty 2, 28d supply, fill #3
  Filled 2024-02-17 – 2024-02-19 (×2): qty 2, 28d supply, fill #4
  Filled 2024-04-04: qty 2, 28d supply, fill #5

## 2023-11-19 ENCOUNTER — Other Ambulatory Visit: Payer: Self-pay

## 2023-11-20 ENCOUNTER — Other Ambulatory Visit: Payer: Self-pay

## 2023-11-30 DIAGNOSIS — S92514A Nondisplaced fracture of proximal phalanx of right lesser toe(s), initial encounter for closed fracture: Secondary | ICD-10-CM | POA: Diagnosis not present

## 2023-12-09 ENCOUNTER — Other Ambulatory Visit: Payer: Self-pay

## 2023-12-16 ENCOUNTER — Other Ambulatory Visit: Payer: Self-pay

## 2023-12-16 MED ORDER — AMPHETAMINE-DEXTROAMPHETAMINE 30 MG PO TABS
1.0000 | ORAL_TABLET | Freq: Two times a day (BID) | ORAL | 0 refills | Status: DC
  Filled 2023-12-16: qty 60, 30d supply, fill #0

## 2023-12-16 MED ORDER — AMPHETAMINE-DEXTROAMPHETAMINE 10 MG PO TABS
10.0000 mg | ORAL_TABLET | Freq: Every evening | ORAL | 0 refills | Status: AC
  Filled 2023-12-16 – 2024-01-18 (×2): qty 90, 90d supply, fill #0

## 2024-01-18 ENCOUNTER — Other Ambulatory Visit: Payer: Self-pay

## 2024-01-18 MED ORDER — LEVOCETIRIZINE DIHYDROCHLORIDE 5 MG PO TABS
5.0000 mg | ORAL_TABLET | Freq: Every evening | ORAL | 1 refills | Status: DC
  Filled 2024-01-18: qty 90, 90d supply, fill #0
  Filled 2024-02-17 – 2024-04-04 (×2): qty 90, 90d supply, fill #1

## 2024-01-18 MED ORDER — AMPHETAMINE-DEXTROAMPHETAMINE 30 MG PO TABS
1.0000 | ORAL_TABLET | Freq: Two times a day (BID) | ORAL | 0 refills | Status: DC
  Filled 2024-01-18: qty 60, 30d supply, fill #0

## 2024-01-28 ENCOUNTER — Other Ambulatory Visit: Payer: Self-pay

## 2024-01-28 DIAGNOSIS — I10 Essential (primary) hypertension: Secondary | ICD-10-CM | POA: Diagnosis not present

## 2024-01-28 MED ORDER — BISOPROLOL-HYDROCHLOROTHIAZIDE 5-6.25 MG PO TABS
1.0000 | ORAL_TABLET | Freq: Every day | ORAL | 11 refills | Status: AC
  Filled 2024-01-28 (×2): qty 30, 30d supply, fill #0
  Filled 2024-02-17 – 2024-02-26 (×2): qty 30, 30d supply, fill #1
  Filled 2024-04-04: qty 30, 30d supply, fill #2
  Filled 2024-05-19: qty 30, 30d supply, fill #3
  Filled 2024-07-07: qty 30, 30d supply, fill #4
  Filled 2024-08-18: qty 30, 30d supply, fill #5
  Filled 2024-09-13 – 2024-09-27 (×2): qty 30, 30d supply, fill #6
  Filled 2024-10-27: qty 30, 30d supply, fill #7

## 2024-01-30 ENCOUNTER — Other Ambulatory Visit (HOSPITAL_COMMUNITY): Payer: Self-pay

## 2024-02-05 DIAGNOSIS — Z79899 Other long term (current) drug therapy: Secondary | ICD-10-CM | POA: Diagnosis not present

## 2024-02-05 DIAGNOSIS — R739 Hyperglycemia, unspecified: Secondary | ICD-10-CM | POA: Diagnosis not present

## 2024-02-05 DIAGNOSIS — I10 Essential (primary) hypertension: Secondary | ICD-10-CM | POA: Diagnosis not present

## 2024-02-12 ENCOUNTER — Other Ambulatory Visit: Payer: Self-pay

## 2024-02-12 ENCOUNTER — Other Ambulatory Visit: Payer: Self-pay | Admitting: Internal Medicine

## 2024-02-12 DIAGNOSIS — I10 Essential (primary) hypertension: Secondary | ICD-10-CM | POA: Diagnosis not present

## 2024-02-12 DIAGNOSIS — E66811 Obesity, class 1: Secondary | ICD-10-CM | POA: Diagnosis not present

## 2024-02-12 DIAGNOSIS — F419 Anxiety disorder, unspecified: Secondary | ICD-10-CM | POA: Diagnosis not present

## 2024-02-12 DIAGNOSIS — F909 Attention-deficit hyperactivity disorder, unspecified type: Secondary | ICD-10-CM | POA: Diagnosis not present

## 2024-02-12 DIAGNOSIS — Z0000 Encounter for general adult medical examination without abnormal findings: Secondary | ICD-10-CM | POA: Diagnosis not present

## 2024-02-12 DIAGNOSIS — Z1231 Encounter for screening mammogram for malignant neoplasm of breast: Secondary | ICD-10-CM | POA: Diagnosis not present

## 2024-02-12 DIAGNOSIS — E039 Hypothyroidism, unspecified: Secondary | ICD-10-CM | POA: Diagnosis not present

## 2024-02-12 DIAGNOSIS — E6609 Other obesity due to excess calories: Secondary | ICD-10-CM | POA: Diagnosis not present

## 2024-02-12 MED ORDER — BUSPIRONE HCL 10 MG PO TABS
10.0000 mg | ORAL_TABLET | Freq: Two times a day (BID) | ORAL | 11 refills | Status: AC
  Filled 2024-02-12: qty 60, 30d supply, fill #0
  Filled 2024-03-18: qty 60, 30d supply, fill #1
  Filled 2024-04-04 – 2024-04-27 (×2): qty 60, 30d supply, fill #2
  Filled 2024-06-09: qty 60, 30d supply, fill #3
  Filled 2024-07-07: qty 60, 30d supply, fill #4
  Filled 2024-08-18: qty 60, 30d supply, fill #5
  Filled 2024-09-13 – 2024-09-27 (×2): qty 60, 30d supply, fill #6
  Filled 2024-10-27: qty 60, 30d supply, fill #7

## 2024-02-17 ENCOUNTER — Other Ambulatory Visit: Payer: Self-pay

## 2024-02-17 DIAGNOSIS — H524 Presbyopia: Secondary | ICD-10-CM | POA: Diagnosis not present

## 2024-02-17 MED ORDER — AMPHETAMINE-DEXTROAMPHETAMINE 30 MG PO TABS
30.0000 mg | ORAL_TABLET | Freq: Two times a day (BID) | ORAL | 0 refills | Status: DC
  Filled 2024-02-17: qty 60, 30d supply, fill #0

## 2024-02-18 ENCOUNTER — Other Ambulatory Visit: Payer: Self-pay

## 2024-02-19 ENCOUNTER — Other Ambulatory Visit: Payer: Self-pay

## 2024-04-04 ENCOUNTER — Other Ambulatory Visit: Payer: Self-pay

## 2024-04-05 ENCOUNTER — Other Ambulatory Visit: Payer: Self-pay

## 2024-04-05 MED ORDER — AMPHETAMINE-DEXTROAMPHETAMINE 30 MG PO TABS
1.0000 | ORAL_TABLET | Freq: Two times a day (BID) | ORAL | 0 refills | Status: DC
  Filled 2024-04-05: qty 60, 30d supply, fill #0

## 2024-04-14 ENCOUNTER — Other Ambulatory Visit: Payer: Self-pay

## 2024-05-19 ENCOUNTER — Other Ambulatory Visit: Payer: Self-pay

## 2024-05-19 MED ORDER — LEVOTHYROXINE SODIUM 100 MCG PO TABS
100.0000 ug | ORAL_TABLET | Freq: Every day | ORAL | 1 refills | Status: AC
  Filled 2024-05-19: qty 90, 90d supply, fill #0
  Filled 2024-08-18: qty 90, 90d supply, fill #1

## 2024-05-19 MED ORDER — ZEPBOUND 15 MG/0.5ML ~~LOC~~ SOAJ
15.0000 mg | SUBCUTANEOUS | 1 refills | Status: AC
  Filled 2024-05-19: qty 2, 28d supply, fill #0
  Filled 2024-07-07: qty 2, 28d supply, fill #1
  Filled 2024-08-18: qty 2, 28d supply, fill #2
  Filled 2024-09-13 – 2024-09-27 (×2): qty 2, 28d supply, fill #3

## 2024-05-20 ENCOUNTER — Other Ambulatory Visit: Payer: Self-pay

## 2024-05-20 MED ORDER — AMPHETAMINE-DEXTROAMPHETAMINE 30 MG PO TABS
1.0000 | ORAL_TABLET | Freq: Two times a day (BID) | ORAL | 0 refills | Status: DC
  Filled 2024-05-20: qty 10, 5d supply, fill #0
  Filled 2024-05-20: qty 50, 25d supply, fill #0

## 2024-05-20 MED ORDER — AMPHETAMINE-DEXTROAMPHETAMINE 10 MG PO TABS
10.0000 mg | ORAL_TABLET | Freq: Every evening | ORAL | 0 refills | Status: DC
  Filled 2024-05-20: qty 90, 90d supply, fill #0

## 2024-05-26 DIAGNOSIS — E033 Postinfectious hypothyroidism: Secondary | ICD-10-CM | POA: Diagnosis not present

## 2024-05-26 DIAGNOSIS — F909 Attention-deficit hyperactivity disorder, unspecified type: Secondary | ICD-10-CM | POA: Diagnosis not present

## 2024-05-26 DIAGNOSIS — Z79899 Other long term (current) drug therapy: Secondary | ICD-10-CM | POA: Diagnosis not present

## 2024-05-26 DIAGNOSIS — Z1231 Encounter for screening mammogram for malignant neoplasm of breast: Secondary | ICD-10-CM | POA: Diagnosis not present

## 2024-05-26 DIAGNOSIS — I10 Essential (primary) hypertension: Secondary | ICD-10-CM | POA: Diagnosis not present

## 2024-05-26 DIAGNOSIS — E6609 Other obesity due to excess calories: Secondary | ICD-10-CM | POA: Diagnosis not present

## 2024-05-30 ENCOUNTER — Ambulatory Visit
Admission: RE | Admit: 2024-05-30 | Discharge: 2024-05-30 | Disposition: A | Discharge status: Home / Self Care | Source: Ambulatory Visit | Attending: Internal Medicine | Admitting: Internal Medicine

## 2024-05-30 DIAGNOSIS — Z1231 Encounter for screening mammogram for malignant neoplasm of breast: Secondary | ICD-10-CM | POA: Diagnosis not present

## 2024-06-20 ENCOUNTER — Other Ambulatory Visit: Payer: Self-pay

## 2024-07-07 ENCOUNTER — Other Ambulatory Visit: Payer: Self-pay

## 2024-07-07 MED ORDER — AMPHETAMINE-DEXTROAMPHETAMINE 30 MG PO TABS
1.0000 | ORAL_TABLET | Freq: Two times a day (BID) | ORAL | 0 refills | Status: DC
  Filled 2024-07-07: qty 60, 30d supply, fill #0

## 2024-07-12 ENCOUNTER — Other Ambulatory Visit: Payer: Self-pay

## 2024-07-12 MED ORDER — TRAZODONE HCL 100 MG PO TABS
100.0000 mg | ORAL_TABLET | Freq: Every day | ORAL | 1 refills | Status: AC
  Filled 2024-07-12: qty 90, 90d supply, fill #0
  Filled 2024-10-05: qty 90, 90d supply, fill #1

## 2024-08-18 ENCOUNTER — Other Ambulatory Visit: Payer: Self-pay

## 2024-08-18 MED ORDER — AMPHETAMINE-DEXTROAMPHETAMINE 30 MG PO TABS
1.0000 | ORAL_TABLET | Freq: Two times a day (BID) | ORAL | 0 refills | Status: AC
  Filled 2024-08-18: qty 60, 30d supply, fill #0

## 2024-08-18 MED ORDER — AMPHETAMINE-DEXTROAMPHETAMINE 10 MG PO TABS
10.0000 mg | ORAL_TABLET | Freq: Every evening | ORAL | 0 refills | Status: AC
  Filled 2024-08-18: qty 90, 90d supply, fill #0

## 2024-08-18 MED ORDER — LEVOCETIRIZINE DIHYDROCHLORIDE 5 MG PO TABS
5.0000 mg | ORAL_TABLET | Freq: Every evening | ORAL | 1 refills | Status: AC
  Filled 2024-08-18: qty 90, 90d supply, fill #0

## 2024-08-19 ENCOUNTER — Other Ambulatory Visit: Payer: Self-pay

## 2024-08-24 DIAGNOSIS — I10 Essential (primary) hypertension: Secondary | ICD-10-CM | POA: Diagnosis not present

## 2024-08-24 DIAGNOSIS — Z79899 Other long term (current) drug therapy: Secondary | ICD-10-CM | POA: Diagnosis not present

## 2024-08-31 ENCOUNTER — Other Ambulatory Visit: Payer: Self-pay

## 2024-08-31 DIAGNOSIS — I10 Essential (primary) hypertension: Secondary | ICD-10-CM | POA: Diagnosis not present

## 2024-08-31 DIAGNOSIS — F419 Anxiety disorder, unspecified: Secondary | ICD-10-CM | POA: Diagnosis not present

## 2024-08-31 DIAGNOSIS — E6609 Other obesity due to excess calories: Secondary | ICD-10-CM | POA: Diagnosis not present

## 2024-08-31 DIAGNOSIS — E039 Hypothyroidism, unspecified: Secondary | ICD-10-CM | POA: Diagnosis not present

## 2024-08-31 MED ORDER — DESVENLAFAXINE SUCCINATE ER 50 MG PO TB24
50.0000 mg | ORAL_TABLET | Freq: Every day | ORAL | 3 refills | Status: AC
  Filled 2024-08-31 – 2024-10-05 (×2): qty 90, 90d supply, fill #0

## 2024-09-13 ENCOUNTER — Other Ambulatory Visit: Payer: Self-pay

## 2024-09-13 MED ORDER — AMPHETAMINE-DEXTROAMPHETAMINE 30 MG PO TABS
30.0000 mg | ORAL_TABLET | Freq: Two times a day (BID) | ORAL | 0 refills | Status: AC
  Filled 2024-10-21: qty 60, 30d supply, fill #0

## 2024-09-24 ENCOUNTER — Other Ambulatory Visit: Payer: Self-pay

## 2024-09-27 ENCOUNTER — Other Ambulatory Visit: Payer: Self-pay

## 2024-10-05 ENCOUNTER — Other Ambulatory Visit: Payer: Self-pay

## 2024-10-21 ENCOUNTER — Other Ambulatory Visit: Payer: Self-pay

## 2024-10-24 ENCOUNTER — Other Ambulatory Visit: Payer: Self-pay

## 2024-10-25 ENCOUNTER — Other Ambulatory Visit: Payer: Self-pay

## 2024-10-25 MED ORDER — OFLOXACIN 0.3 % OT SOLN
4.0000 [drp] | Freq: Two times a day (BID) | OTIC | 0 refills | Status: AC
  Filled 2024-10-25: qty 10, 25d supply, fill #0
